# Patient Record
Sex: Female | Born: 1995 | Race: Black or African American | Hispanic: No | Marital: Single | Smoking: Never smoker
Health system: Southern US, Community
[De-identification: ages and names within clinical notes are randomized; demographics above are authoritative.]

## PROBLEM LIST (undated history)

## (undated) ENCOUNTER — Emergency Department: Admission: EM | Payer: BC Managed Care – PPO | Source: Home / Self Care

## (undated) DIAGNOSIS — F419 Anxiety disorder, unspecified: Secondary | ICD-10-CM

## (undated) DIAGNOSIS — I10 Essential (primary) hypertension: Secondary | ICD-10-CM

## (undated) DIAGNOSIS — F909 Attention-deficit hyperactivity disorder, unspecified type: Secondary | ICD-10-CM

## (undated) DIAGNOSIS — F319 Bipolar disorder, unspecified: Secondary | ICD-10-CM

## (undated) DIAGNOSIS — J45909 Unspecified asthma, uncomplicated: Secondary | ICD-10-CM

## (undated) DIAGNOSIS — F329 Major depressive disorder, single episode, unspecified: Secondary | ICD-10-CM

## (undated) HISTORY — DX: Bipolar disorder, unspecified: F31.9

## (undated) HISTORY — DX: Anxiety disorder, unspecified: F41.9

## (undated) HISTORY — PX: TONSILLECTOMY: SUR1361

## (undated) HISTORY — DX: Attention-deficit hyperactivity disorder, unspecified type: F90.9

## (undated) HISTORY — DX: Major depressive disorder, single episode, unspecified: F32.9

## (undated) HISTORY — PX: ADENOIDECTOMY: SUR15

---
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
## 2014-03-17 ENCOUNTER — Emergency Department: Payer: Self-pay | Admitting: Emergency Medicine

## 2014-07-18 ENCOUNTER — Emergency Department: Payer: Self-pay | Admitting: Emergency Medicine

## 2014-07-18 LAB — COMPREHENSIVE METABOLIC PANEL
ALBUMIN: 3.5 g/dL — AB (ref 3.8–5.6)
ANION GAP: 8 (ref 7–16)
AST: 16 U/L (ref 0–26)
Alkaline Phosphatase: 81 U/L
BUN: 9 mg/dL (ref 9–21)
Bilirubin,Total: 0.5 mg/dL (ref 0.2–1.0)
CHLORIDE: 104 mmol/L (ref 97–107)
Calcium, Total: 8.8 mg/dL — ABNORMAL LOW (ref 9.0–10.7)
Co2: 27 mmol/L — ABNORMAL HIGH (ref 16–25)
Creatinine: 0.9 mg/dL (ref 0.60–1.30)
EGFR (African American): >60
Glucose: 117 mg/dL — ABNORMAL HIGH (ref 65–99)
Osmolality: 277 (ref 275–301)
Potassium: 4.1 mmol/L (ref 3.3–4.7)
SGPT (ALT): 27 U/L
SODIUM: 139 mmol/L (ref 132–141)
Total Protein: 7.8 g/dL (ref 6.4–8.6)

## 2014-07-18 LAB — CBC WITH DIFFERENTIAL/PLATELET
BASOS ABS: 0.1 10*3/uL (ref 0.0–0.1)
Basophil %: 0.5 %
EOS PCT: 0.6 %
Eosinophil #: 0.1 10*3/uL (ref 0.0–0.7)
HCT: 33.7 % — ABNORMAL LOW (ref 35.0–47.0)
HGB: 10.3 g/dL — ABNORMAL LOW (ref 12.0–16.0)
Lymphocyte #: 1.9 10*3/uL (ref 1.0–3.6)
Lymphocyte %: 16.8 %
MCH: 25.2 pg — ABNORMAL LOW (ref 26.0–34.0)
MCHC: 30.6 g/dL — ABNORMAL LOW (ref 32.0–36.0)
MCV: 82 fL (ref 80–100)
MONOS PCT: 6.6 %
Monocyte #: 0.7 x10 3/mm (ref 0.2–0.9)
NEUTROS ABS: 8.3 10*3/uL — AB (ref 1.4–6.5)
NEUTROS PCT: 75.5 %
PLATELETS: 399 10*3/uL (ref 150–440)
RBC: 4.09 10*6/uL (ref 3.80–5.20)
RDW: 17.7 % — AB (ref 11.5–14.5)
WBC: 11 10*3/uL (ref 3.6–11.0)

## 2014-07-18 LAB — URINALYSIS, COMPLETE
BACTERIA: NONE SEEN
Bilirubin,UR: NEGATIVE
Blood: NEGATIVE
Glucose,UR: NEGATIVE mg/dL (ref 0–75)
KETONE: NEGATIVE
Leukocyte Esterase: NEGATIVE
Nitrite: NEGATIVE
Ph: 7 (ref 4.5–8.0)
Protein: NEGATIVE
RBC,UR: 1 /HPF (ref 0–5)
SPECIFIC GRAVITY: 1.014 (ref 1.003–1.030)
Squamous Epithelial: 6

## 2014-08-18 ENCOUNTER — Emergency Department: Payer: Self-pay | Admitting: Emergency Medicine

## 2015-02-09 ENCOUNTER — Other Ambulatory Visit: Payer: Self-pay

## 2015-02-09 ENCOUNTER — Encounter: Payer: Self-pay | Admitting: *Deleted

## 2015-02-09 ENCOUNTER — Emergency Department: Payer: Medicaid Other

## 2015-02-09 ENCOUNTER — Emergency Department
Admission: EM | Admit: 2015-02-09 | Discharge: 2015-02-09 | Disposition: A | Discharge status: Home / Self Care | Payer: Medicaid Other | Attending: Emergency Medicine | Admitting: Emergency Medicine

## 2015-02-09 DIAGNOSIS — R0789 Other chest pain: Secondary | ICD-10-CM

## 2015-02-09 DIAGNOSIS — Z79899 Other long term (current) drug therapy: Secondary | ICD-10-CM | POA: Diagnosis not present

## 2015-02-09 DIAGNOSIS — I10 Essential (primary) hypertension: Secondary | ICD-10-CM | POA: Insufficient documentation

## 2015-02-09 DIAGNOSIS — R079 Chest pain, unspecified: Secondary | ICD-10-CM | POA: Diagnosis present

## 2015-02-09 HISTORY — DX: Essential (primary) hypertension: I10

## 2015-02-09 HISTORY — DX: Unspecified asthma, uncomplicated: J45.909

## 2015-02-09 LAB — CBC
HCT: 33.5 % — ABNORMAL LOW (ref 35.0–47.0)
Hemoglobin: 10.5 g/dL — ABNORMAL LOW (ref 12.0–16.0)
MCH: 25.4 pg — ABNORMAL LOW (ref 26.0–34.0)
MCHC: 31.4 g/dL — ABNORMAL LOW (ref 32.0–36.0)
MCV: 80.8 fL (ref 80.0–100.0)
Platelets: 292 10*3/uL (ref 150–440)
RBC: 4.15 MIL/uL (ref 3.80–5.20)
RDW: 19 % — AB (ref 11.5–14.5)
WBC: 12.7 10*3/uL — AB (ref 3.6–11.0)

## 2015-02-09 LAB — BASIC METABOLIC PANEL
Anion gap: 9 (ref 5–15)
BUN: 9 mg/dL (ref 6–20)
CALCIUM: 9.1 mg/dL (ref 8.9–10.3)
CO2: 24 mmol/L (ref 22–32)
Chloride: 105 mmol/L (ref 101–111)
Creatinine, Ser: 0.8 mg/dL (ref 0.44–1.00)
GFR calc Af Amer: >60 mL/min (ref >60)
Glucose, Bld: 90 mg/dL (ref 65–99)
Potassium: 4.3 mmol/L (ref 3.5–5.1)
Sodium: 138 mmol/L (ref 135–145)

## 2015-02-09 LAB — TROPONIN I

## 2015-02-09 MED ORDER — RANITIDINE HCL 150 MG/10ML PO SYRP
150.0000 mg | ORAL_SOLUTION | Freq: Once | ORAL | Status: AC
  Administered 2015-02-09: 150 mg via ORAL
  Filled 2015-02-09: qty 10

## 2015-02-09 MED ORDER — GI COCKTAIL ~~LOC~~
ORAL | Status: AC
  Administered 2015-02-09: 30 mL via ORAL
  Filled 2015-02-09: qty 30

## 2015-02-09 MED ORDER — ALUM & MAG HYDROXIDE-SIMETH 200-200-20 MG/5ML PO SUSP: 30.0000 mL | Freq: Once | ORAL | Status: AC

## 2015-02-09 MED ORDER — KETOROLAC TROMETHAMINE 30 MG/ML IJ SOLN
INTRAMUSCULAR | Status: AC
  Filled 2015-02-09: qty 1

## 2015-02-09 MED ORDER — RANITIDINE HCL 150 MG PO TABS: 150.0000 mg | ORAL_TABLET | Freq: Two times a day (BID) | ORAL | Status: AC

## 2015-02-09 MED ORDER — KETOROLAC TROMETHAMINE 30 MG/ML IJ SOLN
30.0000 mg | Freq: Once | INTRAMUSCULAR | Status: AC
  Administered 2015-02-09: 30 mg via INTRAMUSCULAR

## 2015-02-09 NOTE — ED Provider Notes (Signed)
[facility] Emergency Department Provider Note  ____________________________________________  Time seen: 7:15 AM  I have reviewed the triage vital signs and the nursing notes.   HISTORY  Chief Complaint Chest Pain     HPI Kim Little is a [age] y.o. female who woke up approximately 2 in the morning with chest pain. She has a history of anemia and asthma. She has never had chest pain like this before. She was feeling well yesterday. The pain was fairly severe. It was in the middle of her chest. It did not radiate to other locations. It has improved at this point. The patient does not site any activities or circumstances that make the pain better or worse.     Past Medical History  Diagnosis Date  . Asthma   . Hypertension     There are no active problems to display for this patient.   Past Surgical History  Procedure Laterality Date  . Tonsillectomy    . Adenoidectomy      Current Outpatient Rx  Name  Route  Sig  Dispense  Refill  . albuterol (PROVENTIL HFA;VENTOLIN HFA) 108 (90 BASE) MCG/ACT inhaler   Inhalation   Inhale 2 puffs into the lungs every 6 (six) hours as needed for wheezing or shortness of breath.         . IRON PO   Oral   Take 1 tablet by mouth daily.         Marland Kitchen. lisdexamfetamine (VYVANSE) 40 MG capsule   Oral   Take 40 mg by mouth every morning.         Marland Kitchen. lisinopril (PRINIVIL,ZESTRIL) 10 MG tablet   Oral   Take 10 mg by mouth daily.         . ranitidine (ZANTAC) 150 MG tablet   Oral   Take 1 tablet (150 mg total) by mouth 2 (two) times daily.   28 tablet   1     Allergies Review of patient's allergies indicates no known allergies.  No family history on file.  Social History History  Substance Use Topics  . Smoking status: Never Smoker   . Smokeless tobacco: Not on file  . Alcohol Use: No    Review of Systems  Constitutional: Negative for fever. ENT: Negative for sore  throat. Cardiovascular: Positive for chest pain. Respiratory: Negative for shortness of breath. Gastrointestinal: Negative for abdominal pain, vomiting and diarrhea, though the patient does report a history of irritable bowel syndrome. Genitourinary: Negative for dysuria. Musculoskeletal: Negative for back pain. Skin: Negative for rash. Neurological: Negative for headaches   10-point ROS otherwise negative.  ____________________________________________   PHYSICAL EXAM:  VITAL SIGNS: ED Triage Vitals  Enc Vitals Group     BP 02/09/15 0250 126/63 mmHg     Pulse Rate 02/09/15 0250 84     Resp 02/09/15 0250 18     Temp 02/09/15 0250 98.3 F (36.8 C)     Temp src --      SpO2 02/09/15 0250 99 %     Weight 02/09/15 0250 355 lb (161.027 kg)     Height 02/09/15 0250 5\' 9"  (1.753 m)     Head Cir --      Peak Flow --      Pain Score 02/09/15 0251 10     Pain Loc --      Pain Edu? --      Excl. in GC? --     Constitutional: Alert and oriented. Patient is  interactive and communicative. Notable obesity. ENT   Head: Normocephalic and atraumatic.   Nose: No congestion/rhinnorhea.   Mouth/Throat: Mucous membranes are moist. Cardiovascular: Normal rate, regular rhythm. There is a mild 1 to 2/6 ejection murmur. The chest wall is notably tender on palpation, especially moving into the left pectoralis. Respiratory: Normal respiratory effort without tachypnea. Breath sounds are clear and equal bilaterally. No wheezes/rales/rhonchi. Gastrointestinal: Soft and nontender. No distention.  Back: There is no CVA tenderness. Musculoskeletal: Nontender with normal range of motion in all extremities.  No noted edema of either extremity. Neurologic:  Normal speech and language. No gross focal neurologic deficits are appreciated.  Skin:  Skin is warm, dry. No rash noted. Psychiatric: Mood and affect are normal. Speech and behavior are normal.  ____________________________________________     LABS (pertinent positives/negatives)  Notable for anemia with hemoglobin of 10.5. White blood cell count of 12.7. Troponin that was ordered by the nurses is negative.  ____________________________________________   EKG  At 2:56 AM normal sinus rhythm at 78 with a slightly [place] axis at 96. Intervals are within normal limits. No ST elevation or depression.  ____________________________________________    RADIOLOGY  Normal chest x-ray  ____________________________________________   PROCEDURES  Procedure(s) performed: None  Critical Care performed: No  ____________________________________________   INITIAL IMPRESSION / ASSESSMENT AND PLAN / ED COURSE  I directly reviewed my standard differential diagnosis with the patient and her mother. At 18 think the chance of coronary disease is extremely low. EKG and troponin are normal. There is no sign that this is a pulmonary emboli. She has a normal heart rate, oxygenation of 100% on room air, no known risk factors, no asymmetrical swelling. The patient is notably tender on palpation of the chest wall. It is unclear if this is the same as the pain she was having this past evening. Given the time factors, that the patient woke up with this pain, I believe it's more likely that this could be esophageal. We will treat the patient with a GI cocktail and Zantac in case this is esophageal and, after discussion with the patient and her mother, a shot of Toradol for possible chest wall pain.  I've asked the patient to follow up with D primary care admit been is been her primary provider in the past. I advised him to return to the emergency Department if things are worse or they have any other concerns.  ____________________________________________   FINAL CLINICAL IMPRESSION(S) / ED DIAGNOSES  Final diagnoses:  Chest pain of unknown etiology      Darien Ramusavid W Elyanna Wallick, MD 02/09/15 1515

## 2015-02-09 NOTE — ED Notes (Signed)
Pt reports she woke up tonight with a sharp pain in the center of her chest.

## 2015-02-09 NOTE — Discharge Instructions (Signed)
It is not clear what is causing her chest pain specifically today. We discussed a list of possibilities. Your blood tests and EKG and chest x-ray looked good. There is a strong possibility this is from your esophagus. Take Zantac as prescribed. Follow-up with your regular doctor this coming week for further assistance. Return to the emergency department if you feel worse, if he have shortness of breath, if you have any fever, or you have other urgent concerns.  Chest Pain (Nonspecific) It is often hard to give a specific diagnosis for the cause of chest pain. There is always a chance that your pain could be related to something serious, such as a heart attack or a blood clot in the lungs. You need to follow up with your health care provider for further evaluation. CAUSES   Heartburn.  Pneumonia or bronchitis.  Anxiety or stress.  Inflammation around your heart (pericarditis) or lung (pleuritis or pleurisy).  A blood clot in the lung.  A collapsed lung (pneumothorax). It can develop suddenly on its own (spontaneous pneumothorax) or from trauma to the chest.  Shingles infection (herpes zoster virus). The chest wall is composed of bones, muscles, and cartilage. Any of these can be the source of the pain.  The bones can be bruised by injury.  The muscles or cartilage can be strained by coughing or overwork.  The cartilage can be affected by inflammation and become sore (costochondritis). DIAGNOSIS  Lab tests or other studies may be needed to find the cause of your pain. Your health care provider may have you take a test called an ambulatory electrocardiogram (ECG). An ECG records your heartbeat patterns over a 24-hour period. You may also have other tests, such as:  Transthoracic echocardiogram (TTE). During echocardiography, sound waves are used to evaluate how blood flows through your heart.  Transesophageal echocardiogram (TEE).  Cardiac monitoring. This allows your health care  provider to monitor your heart rate and rhythm in real time.  Holter monitor. This is a portable device that records your heartbeat and can help diagnose heart arrhythmias. It allows your health care provider to track your heart activity for several days, if needed.  Stress tests by exercise or by giving medicine that makes the heart beat faster. TREATMENT   Treatment depends on what may be causing your chest pain. Treatment may include:  Acid blockers for heartburn.  Anti-inflammatory medicine.  Pain medicine for inflammatory conditions.  Antibiotics if an infection is present.  You may be advised to change lifestyle habits. This includes stopping smoking and avoiding alcohol, caffeine, and chocolate.  You may be advised to keep your head raised (elevated) when sleeping. This reduces the chance of acid going backward from your stomach into your esophagus. Most of the time, nonspecific chest pain will improve within 2-3 days with rest and mild pain medicine.  HOME CARE INSTRUCTIONS   If antibiotics were prescribed, take them as directed. Finish them even if you start to feel better.  For the next few days, avoid physical activities that bring on chest pain. Continue physical activities as directed.  Do not use any tobacco products, including cigarettes, chewing tobacco, or electronic cigarettes.  Avoid drinking alcohol.  Only take medicine as directed by your health care provider.  Follow your health care provider's suggestions for further testing if your chest pain does not go away.  Keep any follow-up appointments you made. If you do not go to an appointment, you could develop lasting (chronic) problems with pain. If  there is any problem keeping an appointment, call to reschedule. SEEK MEDICAL CARE IF:   Your chest pain does not go away, even after treatment.  You have a rash with blisters on your chest.  You have a fever. SEEK IMMEDIATE MEDICAL CARE IF:   You have  increased chest pain or pain that spreads to your arm, neck, jaw, back, or abdomen.  You have shortness of breath.  You have an increasing cough, or you cough up blood.  You have severe back or abdominal pain.  You feel nauseous or vomit.  You have severe weakness.  You faint.  You have chills. This is an emergency. Do not wait to see if the pain will go away. Get medical help at once. Call your local emergency services (911 in [place] Do not drive yourself to the hospital. MAKE SURE YOU:   Understand these instructions.  Will watch your condition.  Will get help right away if you are not doing well or get worse. Document Released: 06/26/2005 Document Revised: 09/21/2013 Document Reviewed: 04/21/2008 [facility] Patient Information 2015 [place] [place]. This information is not intended to replace advice given to you by your health care provider. Make sure you discuss any questions you have with your health care provider.

## 2015-04-02 ENCOUNTER — Encounter: Payer: Self-pay | Admitting: Emergency Medicine

## 2015-04-02 DIAGNOSIS — G43909 Migraine, unspecified, not intractable, without status migrainosus: Secondary | ICD-10-CM | POA: Diagnosis present

## 2015-04-02 DIAGNOSIS — Z87891 Personal history of nicotine dependence: Secondary | ICD-10-CM | POA: Diagnosis not present

## 2015-04-02 DIAGNOSIS — Z79899 Other long term (current) drug therapy: Secondary | ICD-10-CM | POA: Diagnosis not present

## 2015-04-02 DIAGNOSIS — I10 Essential (primary) hypertension: Secondary | ICD-10-CM | POA: Insufficient documentation

## 2015-04-02 DIAGNOSIS — G43819 Other migraine, intractable, without status migrainosus: Secondary | ICD-10-CM | POA: Insufficient documentation

## 2015-04-02 NOTE — ED Notes (Signed)
Pt states daily headache for "two weeks" pt states had nausea earlier today, but none now. Pt states has been sensitive to light, but not currently. Pt denies known fever.

## 2015-04-03 ENCOUNTER — Emergency Department: Payer: Medicaid Other

## 2015-04-03 ENCOUNTER — Emergency Department
Admission: EM | Admit: 2015-04-03 | Discharge: 2015-04-03 | Disposition: A | Discharge status: Home / Self Care | Payer: Medicaid Other | Attending: Emergency Medicine | Admitting: Emergency Medicine

## 2015-04-03 DIAGNOSIS — G43819 Other migraine, intractable, without status migrainosus: Secondary | ICD-10-CM

## 2015-04-03 MED ORDER — METOCLOPRAMIDE HCL 5 MG/ML IJ SOLN
INTRAMUSCULAR | Status: AC
  Filled 2015-04-03: qty 2

## 2015-04-03 MED ORDER — KETOROLAC TROMETHAMINE 30 MG/ML IJ SOLN
INTRAMUSCULAR | Status: AC
  Filled 2015-04-03: qty 1

## 2015-04-03 MED ORDER — BUTALBITAL-APAP-CAFFEINE 50-325-40 MG PO TABS
ORAL_TABLET | ORAL | Status: AC
  Filled 2015-04-03: qty 2

## 2015-04-03 MED ORDER — METHYLPREDNISOLONE SODIUM SUCC 125 MG IJ SOLR
125.0000 mg | Freq: Once | INTRAMUSCULAR | Status: AC
  Administered 2015-04-03: 125 mg via INTRAVENOUS

## 2015-04-03 MED ORDER — BUTALBITAL-APAP-CAFFEINE 50-325-40 MG PO TABS
2.0000 | ORAL_TABLET | Freq: Once | ORAL | Status: AC
  Administered 2015-04-03: 2 via ORAL

## 2015-04-03 MED ORDER — DIPHENHYDRAMINE HCL 50 MG/ML IJ SOLN
INTRAMUSCULAR | Status: AC
  Filled 2015-04-03: qty 1

## 2015-04-03 MED ORDER — BUTALBITAL-APAP-CAFFEINE 50-325-40 MG PO TABS: 1.0000 | ORAL_TABLET | Freq: Four times a day (QID) | ORAL | Status: AC | PRN

## 2015-04-03 MED ORDER — KETOROLAC TROMETHAMINE 30 MG/ML IJ SOLN
30.0000 mg | Freq: Once | INTRAMUSCULAR | Status: AC
  Administered 2015-04-03: 30 mg via INTRAVENOUS

## 2015-04-03 MED ORDER — METHYLPREDNISOLONE SODIUM SUCC 125 MG IJ SOLR
INTRAMUSCULAR | Status: AC
  Filled 2015-04-03: qty 2

## 2015-04-03 MED ORDER — DIPHENHYDRAMINE HCL 50 MG/ML IJ SOLN
25.0000 mg | Freq: Once | INTRAMUSCULAR | Status: AC
  Administered 2015-04-03: 25 mg via INTRAVENOUS

## 2015-04-03 MED ORDER — METOCLOPRAMIDE HCL 5 MG/ML IJ SOLN
10.0000 mg | Freq: Once | INTRAMUSCULAR | Status: AC
  Administered 2015-04-03: 10 mg via INTRAVENOUS

## 2015-04-03 MED ORDER — SODIUM CHLORIDE 0.9 % IV BOLUS (SEPSIS)
1000.0000 mL | Freq: Once | INTRAVENOUS | Status: AC
  Administered 2015-04-03: 1000 mL via INTRAVENOUS

## 2015-04-03 NOTE — Discharge Instructions (Signed)

## 2015-04-03 NOTE — ED Notes (Signed)
Report to anna, rn

## 2015-04-03 NOTE — ED Provider Notes (Signed)
[facility] Emergency Department Provider Note  ____________________________________________  Time seen: Approximately 310 AM  I have reviewed the triage vital signs and the nursing notes.   HISTORY  Chief Complaint Headache    HPI Kim Little is a 19 y.o. female with a history of migraines who comes in with a headache 2 weeks. The patient reports that the headache seems to be worse today. She has tried Tylenol, Aleve, Excedrin. The patient reports that her headaches have been worse than this in the past but has never lasted this long. She reports her pain as a 9 out of 10 in intensity. She's had some nausea with no vomiting and some light and sound sensitivity. The patient reports that she has not had any change in caffeine intake nor has she had any decrease in sleep. The patient reports that the symptoms are typical of her migraines. The patient also reports that she has not taken her blood pressure medicine over a month. She reports that she has not been able to follow-up with her doctor. The patient is not able to tolerate the symptoms and the pain so she decided to come in for further evaluation and treatment.   Past Medical History  Diagnosis Date  . Asthma   . Hypertension   migraines Anemia  There are no active problems to display for this patient.   Past Surgical History  Procedure Laterality Date  . Tonsillectomy    . Adenoidectomy      Current Outpatient Rx  Name  Route  Sig  Dispense  Refill  . albuterol (PROVENTIL HFA;VENTOLIN HFA) 108 (90 BASE) MCG/ACT inhaler   Inhalation   Inhale 2 puffs into the lungs every 6 (six) hours as needed for wheezing or shortness of breath.         . lisdexamfetamine (VYVANSE) 40 MG capsule   Oral   Take 40 mg by mouth every morning.         Marland Kitchen lisinopril (PRINIVIL,ZESTRIL) 10 MG tablet   Oral   Take 10 mg by mouth daily.         . butalbital-acetaminophen-caffeine (FIORICET) 50-325-40  MG per tablet   Oral   Take 1-2 tablets by mouth every 6 (six) hours as needed for headache.   16 tablet   0   . IRON PO   Oral   Take 1 tablet by mouth daily.         . ranitidine (ZANTAC) 150 MG tablet   Oral   Take 1 tablet (150 mg total) by mouth 2 (two) times daily.   28 tablet   1     Allergies Review of patient's allergies indicates no known allergies.  History reviewed. No pertinent family history.  Social History History  Substance Use Topics  . Smoking status: Former Games developer  . Smokeless tobacco: Never Used  . Alcohol Use: No    Review of Systems Constitutional: No fever/chills Eyes: No visual changes. ENT: No sore throat. Cardiovascular: Denies chest pain. Respiratory: Denies shortness of breath. Gastrointestinal: No abdominal pain.  No nausea, no vomiting.  Genitourinary: Negative for dysuria. Musculoskeletal: Negative for back pain. Skin: Negative for rash. Neurological: Migraine headache  10-point ROS otherwise negative.  ____________________________________________   PHYSICAL EXAM:  VITAL SIGNS: ED Triage Vitals  Enc Vitals Group     BP 04/02/15 2227 140/75 mmHg     Pulse Rate 04/02/15 2227 80     Resp 04/02/15 2227 16     Temp 04/02/15  2227 98.9 F (37.2 C)     Temp Source 04/02/15 2227 Oral     SpO2 04/02/15 2227 100 %     Weight 04/02/15 2227 355 lb (161.027 kg)     Height 04/02/15 2227 5\' 9"  (1.753 m)     Head Cir --      Peak Flow --      Pain Score 04/02/15 2228 8     Pain Loc --      Pain Edu? --      Excl. in GC? --     Constitutional: Alert and oriented. Well appearing and in moderate distress. Eyes: Conjunctivae are normal. PERRL. EOMI. Head: Atraumatic. Nose: No congestion/rhinnorhea. Mouth/Throat: Mucous membranes are moist.  Oropharynx non-erythematous. Cardiovascular: Normal rate, regular rhythm. Grossly normal heart sounds.  Good peripheral circulation. Respiratory: Normal respiratory effort.  No retractions.  Lungs CTAB. Gastrointestinal: Soft and nontender. No distention. Positive bowel sounds Genitourinary: Deferred Musculoskeletal: No lower extremity tenderness nor edema.  Neurologic:  Normal speech and language. No gross focal neurologic deficits are appreciated. Cranial nerves II through XII are grossly intact Skin:  Skin is warm, dry and intact. No rash noted. Psychiatric: Mood and affect are normal.   ____________________________________________   LABS (all labs ordered are listed, but only abnormal results are displayed)  Labs Reviewed - No data to display ____________________________________________  EKG  None ____________________________________________  RADIOLOGY  CT head: Normal head CT ____________________________________________   PROCEDURES  Procedure(s) performed: None  Critical Care performed: No  ____________________________________________   INITIAL IMPRESSION / ASSESSMENT AND PLAN / ED COURSE  Pertinent labs & imaging results that were available during my care of the patient were reviewed by me and considered in my medical decision making (see chart for details).  This is a 19 year old female who has a history of migraines who comes in tonight with a migraine headache for 2 weeks. I will give the patient a dose of Reglan, Benadryl and Toradol as well as a liter of normal saline and I will reassess the patient when she's had these medications.  ----------------------------------------- 6:40 AM on 04/03/2015 -----------------------------------------  The patient reports that she still has a headache that the 7 out of 10 after the medication. I will give her some Solu-Medrol as well as some Fioricet and do a CT scan of the patient's head to determine if there is another cause of her headache. She will be reassessed after she receives these medications.  ----------------------------------------- 7:52 AM on  04/03/2015 -----------------------------------------  The patient reports her headache is improved although not completely gone. She reports though that she does not want any further workup and will feel comfortable being discharged home. I did inform the patient that she can return if any symptoms worsen and I will give her some Fioricet for home. ____________________________________________   FINAL CLINICAL IMPRESSION(S) / ED DIAGNOSES  Final diagnoses:  Other migraine without status migrainosus, intractable      Rebecka ApleyAllison P Webster, MD 04/03/15 [phone]

## 2015-04-03 NOTE — ED Notes (Signed)
Patient transported to CT 

## 2015-06-06 DIAGNOSIS — I10 Essential (primary) hypertension: Secondary | ICD-10-CM | POA: Insufficient documentation

## 2015-06-06 DIAGNOSIS — Z79899 Other long term (current) drug therapy: Secondary | ICD-10-CM | POA: Insufficient documentation

## 2015-06-06 DIAGNOSIS — J029 Acute pharyngitis, unspecified: Secondary | ICD-10-CM | POA: Insufficient documentation

## 2015-06-06 NOTE — ED Notes (Addendum)
Pt states she woke up this morning with diff swallowing.  States worse tonight.  Pt states my throat feels tight.   No sore throat.  No resp distress.

## 2015-06-07 ENCOUNTER — Emergency Department
Admission: EM | Admit: 2015-06-07 | Discharge: 2015-06-07 | Disposition: A | Discharge status: Home / Self Care | Payer: Medicaid Other | Attending: Emergency Medicine | Admitting: Emergency Medicine

## 2015-06-07 ENCOUNTER — Encounter: Payer: Self-pay | Admitting: *Deleted

## 2015-06-07 ENCOUNTER — Emergency Department: Payer: Medicaid Other

## 2015-06-07 DIAGNOSIS — J029 Acute pharyngitis, unspecified: Secondary | ICD-10-CM

## 2015-06-07 LAB — POCT RAPID STREP A: Streptococcus, Group A Screen (Direct): NEGATIVE

## 2015-06-07 MED ORDER — AMOXICILLIN-POT CLAVULANATE 875-125 MG PO TABS
1.0000 | ORAL_TABLET | Freq: Once | ORAL | Status: AC
Start: 2015-06-07 — End: 2015-06-07
  Administered 2015-06-07: 1 via ORAL
  Filled 2015-06-07: qty 1

## 2015-06-07 MED ORDER — IBUPROFEN 400 MG PO TABS
400.0000 mg | ORAL_TABLET | Freq: Once | ORAL | Status: AC
  Administered 2015-06-07: 400 mg via ORAL
  Filled 2015-06-07: qty 1

## 2015-06-07 MED ORDER — AMOXICILLIN-POT CLAVULANATE 875-125 MG PO TABS: 1.0000 | ORAL_TABLET | Freq: Two times a day (BID) | ORAL | Status: AC

## 2015-06-07 NOTE — ED Notes (Signed)
Patient transported to X-ray 

## 2015-06-07 NOTE — ED Notes (Signed)
Lab notified of add on

## 2015-06-07 NOTE — Discharge Instructions (Signed)

## 2015-06-07 NOTE — ED Provider Notes (Signed)
[facility] Emergency Department Provider Note  ____________________________________________  Time seen: 3:00 AM  I have reviewed the triage vital signs and the nursing notes.   HISTORY  Chief Complaint Oral Swelling      HPI Kim Little is a 19 y.o. female presents with sore throat on awakening tonight patient states my throat feels tight and scratchy.     Past Medical History  Diagnosis Date  . Asthma   . Hypertension     There are no active problems to display for this patient.   Past Surgical History  Procedure Laterality Date  . Tonsillectomy    . Adenoidectomy      Current Outpatient Rx  Name  Route  Sig  Dispense  Refill  . albuterol (PROVENTIL HFA;VENTOLIN HFA) 108 (90 BASE) MCG/ACT inhaler   Inhalation   Inhale 2 puffs into the lungs every 6 (six) hours as needed for wheezing or shortness of breath.         . butalbital-acetaminophen-caffeine (FIORICET) 50-325-40 MG per tablet   Oral   Take 1-2 tablets by mouth every 6 (six) hours as needed for headache.   16 tablet   0   . IRON PO   Oral   Take 1 tablet by mouth daily.         Marland Kitchen lisdexamfetamine (VYVANSE) 40 MG capsule   Oral   Take 40 mg by mouth every morning.         Marland Kitchen lisinopril (PRINIVIL,ZESTRIL) 10 MG tablet   Oral   Take 10 mg by mouth daily.         . ranitidine (ZANTAC) 150 MG tablet   Oral   Take 1 tablet (150 mg total) by mouth 2 (two) times daily.   28 tablet   1     Allergies Review of patient's allergies indicates no known allergies.  No family history on file.  Social History Social History  Substance Use Topics  . Smoking status: Never Smoker   . Smokeless tobacco: Never Used  . Alcohol Use: No    Review of Systems  Constitutional: Negative for fever. Eyes: Negative for visual changes. ENT: Positive for sore throat. Cardiovascular: Negative for chest pain. Respiratory: Negative for shortness of  breath. Gastrointestinal: Negative for abdominal pain, vomiting and diarrhea. Genitourinary: Negative for dysuria. Musculoskeletal: Negative for back pain. Skin: Negative for rash. Neurological: Negative for headaches, focal weakness or numbness.   10-point ROS otherwise negative.  ____________________________________________   PHYSICAL EXAM:  VITAL SIGNS: ED Triage Vitals  Enc Vitals Group     BP 06/07/15 0000 133/67 mmHg     Pulse Rate 06/07/15 0000 79     Resp 06/07/15 0000 20     Temp 06/07/15 0000 98 F (36.7 C)     Temp Source 06/07/15 0000 Oral     SpO2 06/07/15 0000 100 %     Weight 06/07/15 0000 365 lb (165.563 kg)     Height 06/07/15 0000  (1.753 m)     Head Cir --      Peak Flow --      Pain Score --      Pain Loc --      Pain Edu? --      Excl. in GC? --     Constitutional: Alert and oriented. Well appearing and in no distress. Eyes: Conjunctivae are normal. PERRL. Normal extraocular movements. ENT   Head: Normocephalic and atraumatic.   Nose: No congestion/rhinnorhea.   Mouth/Throat:  Mucous membranes are moist.   Neck: No stridor. No erythema or exudate noted. Hematological/Lymphatic/Immunilogical: No cervical lymphadenopathy. Cardiovascular: Normal rate, regular rhythm. Normal and symmetric distal pulses are present in all extremities. No murmurs, rubs, or gallops. Respiratory: Normal respiratory effort without tachypnea nor retractions. Breath sounds are clear and equal bilaterally. No wheezes/rales/rhonchi. Gastrointestinal: Soft and nontender. No distention. There is no CVA tenderness. Genitourinary: deferred Musculoskeletal: Nontender with normal range of motion in all extremities. No joint effusions.  No lower extremity tenderness nor edema. Neurologic:  Normal speech and language. No gross focal neurologic deficits are appreciated. Speech is normal.  Skin:  Skin is warm, dry and intact. No rash noted. Psychiatric: Mood and affect  are normal. Speech and behavior are normal. Patient exhibits appropriate insight and judgment.  ____________________________________________    LABS (pertinent positives/negatives)  Rapid strep negative  RADIOLOGY  DG Neck Soft Tissue (Final result) Result time: 06/07/15 03:20:38   Final result by Rad Results In Interface (06/07/15 03:20:38)   Narrative:   CLINICAL DATA: Patient woke up this morning with difficulty swallowing. Throat feels tight.  EXAM: NECK SOFT TISSUES - 1+ VIEW  COMPARISON: None.  FINDINGS: There is no evidence of retropharyngeal soft tissue swelling or epiglottic enlargement. The cervical airway is unremarkable and no radio-opaque foreign body identified.  IMPRESSION: Negative.   Electronically Signed By: Burman Nieves M.D. On: 06/07/2015 03:20        INITIAL IMPRESSION / ASSESSMENT AND PLAN / ED COURSE  Pertinent labs & imaging results that were available during my care of the patient were reviewed by me and considered in my medical decision making (see chart for details).    ____________________________________________   FINAL CLINICAL IMPRESSION(S) / ED DIAGNOSES  Final diagnoses:  None      Darci Current, MD 06/07/15 [phone]

## 2015-06-09 LAB — CULTURE, GROUP A STREP (THRC)

## 2015-06-21 ENCOUNTER — Emergency Department: Payer: Medicaid Other

## 2015-06-21 ENCOUNTER — Emergency Department
Admission: EM | Admit: 2015-06-21 | Discharge: 2015-06-21 | Disposition: A | Discharge status: Home / Self Care | Payer: Self-pay | Attending: Emergency Medicine | Admitting: Emergency Medicine

## 2015-06-21 ENCOUNTER — Encounter: Payer: Self-pay | Admitting: Emergency Medicine

## 2015-06-21 DIAGNOSIS — J45901 Unspecified asthma with (acute) exacerbation: Secondary | ICD-10-CM | POA: Insufficient documentation

## 2015-06-21 DIAGNOSIS — I10 Essential (primary) hypertension: Secondary | ICD-10-CM | POA: Insufficient documentation

## 2015-06-21 DIAGNOSIS — R079 Chest pain, unspecified: Secondary | ICD-10-CM

## 2015-06-21 LAB — BASIC METABOLIC PANEL
ANION GAP: 6 (ref 5–15)
BUN: 9 mg/dL (ref 6–20)
CALCIUM: 8.9 mg/dL (ref 8.9–10.3)
CO2: 27 mmol/L (ref 22–32)
Chloride: 107 mmol/L (ref 101–111)
Creatinine, Ser: 0.72 mg/dL (ref 0.44–1.00)
Glucose, Bld: 100 mg/dL — ABNORMAL HIGH (ref 65–99)
Potassium: 4.1 mmol/L (ref 3.5–5.1)
SODIUM: 140 mmol/L (ref 135–145)

## 2015-06-21 LAB — CBC
HEMATOCRIT: 31.8 % — AB (ref 35.0–47.0)
Hemoglobin: 10.4 g/dL — ABNORMAL LOW (ref 12.0–16.0)
MCH: 25.8 pg — ABNORMAL LOW (ref 26.0–34.0)
MCHC: 32.6 g/dL (ref 32.0–36.0)
MCV: 79.2 fL — ABNORMAL LOW (ref 80.0–100.0)
PLATELETS: 426 10*3/uL (ref 150–440)
RBC: 4.02 MIL/uL (ref 3.80–5.20)
RDW: 21 % — AB (ref 11.5–14.5)
WBC: 10.9 10*3/uL (ref 3.6–11.0)

## 2015-06-21 MED ORDER — IBUPROFEN 800 MG PO TABS: 800.0000 mg | ORAL_TABLET | Freq: Three times a day (TID) | ORAL | Status: DC | PRN

## 2015-06-21 NOTE — ED Provider Notes (Signed)
[facility] Emergency Department Geral Coker Note     Time seen: ----------------------------------------- 9:58 AM on 06/21/2015 -----------------------------------------    I have reviewed the triage vital signs and the nursing notes.   HISTORY  Chief Complaint Chest Pain    HPI Kim Little is a 19 y.o. female who presents ER for chest tightness this morning. Patient also describes pain around her left scapula, denies fevers chills other complaints. Patient denies anxiety, has not had this happen to her before. Patient recently seen in the ER for sore throat.   Past Medical History  Diagnosis Date  . Asthma   . Hypertension     There are no active problems to display for this patient.   Past Surgical History  Procedure Laterality Date  . Tonsillectomy    . Adenoidectomy      Allergies Review of patient's allergies indicates no known allergies.  Social History Social History  Substance Use Topics  . Smoking status: Never Smoker   . Smokeless tobacco: Never Used  . Alcohol Use: No    Review of Systems Constitutional: Negative for fever. Eyes: Negative for visual changes. ENT: Negative for sore throat. Cardiovascular: Positive for chest pain Respiratory: Positive for shortness of breath Gastrointestinal: Negative for abdominal pain, vomiting and diarrhea. Genitourinary: Negative for dysuria. Musculoskeletal: Negative for back pain. Skin: Negative for rash. Neurological: Negative for headaches, focal weakness or numbness.  10-point ROS otherwise negative.  ____________________________________________   PHYSICAL EXAM:  VITAL SIGNS: ED Triage Vitals  Enc Vitals Group     BP 06/21/15 0710 151/71 mmHg     Pulse Rate 06/21/15 0710 71     Resp 06/21/15 0710 20     Temp 06/21/15 0710 97.8 F (36.6 C)     Temp Source 06/21/15 0710 Oral     SpO2 06/21/15 0710 97 %     Weight 06/21/15 0710 365 lb (165.563 kg)     Height  06/21/15 0710  (1.753 m)     Head Cir --      Peak Flow --      Pain Score 06/21/15 0711 8     Pain Loc --      Pain Edu? --      Excl. in GC? --     Constitutional: Alert and oriented. Well appearing and in no distress. Eyes: Conjunctivae are normal. PERRL. Normal extraocular movements. ENT   Head: Normocephalic and atraumatic.   Nose: No congestion/rhinnorhea.   Mouth/Throat: Mucous membranes are moist.   Neck: No stridor. Cardiovascular: Normal rate, regular rhythm. Normal and symmetric distal pulses are present in all extremities. No murmurs, rubs, or gallops. Respiratory: Normal respiratory effort without tachypnea nor retractions. Breath sounds are clear and equal bilaterally. No wheezes/rales/rhonchi. Gastrointestinal: Soft and nontender. No distention. No abdominal bruits.  Musculoskeletal: Nontender with normal range of motion in all extremities. No joint effusions.  No lower extremity tenderness nor edema. Neurologic:  Normal speech and language. No gross focal neurologic deficits are appreciated. Speech is normal. No gait instability. Skin:  Skin is warm, dry and intact. No rash noted. Psychiatric: Mood and affect are normal. Speech and behavior are normal. Patient exhibits appropriate insight and judgment. ____________________________________________  EKG: Interpreted by me. Normal sinus rhythm with normal axis normal intervals. No evidence of hypertrophy or acute infarction. Rate is 73 bpm  ____________________________________________  ED COURSE:  Pertinent labs & imaging results that were available during my care of the patient were reviewed by me and considered  in my medical decision making (see chart for details). Patient's no acute distress, doubt acute serious etiology. ____________________________________________    LABS (pertinent positives/negatives)  Labs Reviewed  BASIC METABOLIC PANEL - Abnormal; Notable for the following:    Glucose,  Bld 100 (*)    All other components within normal limits  CBC - Abnormal; Notable for the following:    Hemoglobin 10.4 (*)    HCT 31.8 (*)    MCV 79.2 (*)    MCH 25.8 (*)    RDW 21.0 (*)    All other components within normal limits    RADIOLOGY Images were viewed by me  Chest x-ray is unremarkable  ____________________________________________  FINAL ASSESSMENT AND PLAN  Chest pain  Plan: Patient with labs and imaging as dictated above. No clear cause for her symptoms are found. She discharged Motrin and advised follow-up with her doctor in 2 days for recheck.   Emily Filbert, MD   Emily Filbert, MD 06/21/15 [phone]

## 2015-06-21 NOTE — Discharge Instructions (Signed)

## 2015-06-21 NOTE — ED Notes (Signed)
Pt presents with chest tightness this am. No acute distress noted in triage.

## 2016-05-17 ENCOUNTER — Emergency Department: Payer: Medicaid Other

## 2016-05-17 ENCOUNTER — Emergency Department
Admission: EM | Admit: 2016-05-17 | Discharge: 2016-05-17 | Disposition: A | Discharge status: Home / Self Care | Payer: Medicaid Other | Attending: Emergency Medicine | Admitting: Emergency Medicine

## 2016-05-17 DIAGNOSIS — S40011A Contusion of right shoulder, initial encounter: Secondary | ICD-10-CM | POA: Diagnosis not present

## 2016-05-17 DIAGNOSIS — I10 Essential (primary) hypertension: Secondary | ICD-10-CM | POA: Diagnosis not present

## 2016-05-17 DIAGNOSIS — Y9301 Activity, walking, marching and hiking: Secondary | ICD-10-CM | POA: Diagnosis not present

## 2016-05-17 DIAGNOSIS — T148XXA Other injury of unspecified body region, initial encounter: Secondary | ICD-10-CM

## 2016-05-17 DIAGNOSIS — W2209XA Striking against other stationary object, initial encounter: Secondary | ICD-10-CM | POA: Diagnosis not present

## 2016-05-17 DIAGNOSIS — Y929 Unspecified place or not applicable: Secondary | ICD-10-CM | POA: Diagnosis not present

## 2016-05-17 DIAGNOSIS — M25511 Pain in right shoulder: Secondary | ICD-10-CM | POA: Diagnosis present

## 2016-05-17 DIAGNOSIS — J45909 Unspecified asthma, uncomplicated: Secondary | ICD-10-CM | POA: Insufficient documentation

## 2016-05-17 DIAGNOSIS — Y999 Unspecified external cause status: Secondary | ICD-10-CM | POA: Diagnosis not present

## 2016-05-17 MED ORDER — IBUPROFEN 600 MG PO TABS: 600.0000 mg | ORAL_TABLET | Freq: Four times a day (QID) | ORAL | 0 refills | Status: DC | PRN

## 2016-05-17 NOTE — ED Notes (Signed)
Reviewed pt's prescription. Pt verbalized understanding

## 2016-05-17 NOTE — ED Provider Notes (Signed)
[facility]-EMERGENCY DEPARTMENT Provider Note   CSN: [id number] Arrival date & time: 05/17/16  2027     History   Chief Complaint Chief Complaint  Patient presents with  . Shoulder Pain    HPI Kim Little is a 20 y.o. female who presents today for his right shoulder pain. Patient states just prior to arrival, she was walking through a door and hit her right shoulder against the door frame. She complains of right anterior shoulder pain. Pain is moderate, she has not had a medications for pain. She denies any neck pain numbness tingling or radicular symptoms. She describes the pain as sharp, increased with touch and movement of the right shoulder. She has painful abduction and flexion of the right arm at the shoulder joint.  HPI  Past Medical History:  Diagnosis Date  . Asthma   . Hypertension     There are no active problems to display for this patient.   Past Surgical History:  Procedure Laterality Date  . ADENOIDECTOMY    . TONSILLECTOMY      OB History    Gravida Para Term Preterm AB Living   0 0 0 0 0 0   SAB TAB Ectopic Multiple Live Births   0 0 0 0         Home Medications    Prior to Admission medications   Medication Sig Start Date End Date Taking? Authorizing Provider  albuterol (PROVENTIL HFA;VENTOLIN HFA) 108 (90 BASE) MCG/ACT inhaler Inhale 2 puffs into the lungs every 6 (six) hours as needed for wheezing or shortness of breath.    Historical Provider, MD  ibuprofen (ADVIL,MOTRIN) 600 MG tablet Take 1 tablet (600 mg total) by mouth every 6 (six) hours as needed for moderate pain. 05/17/16   Evon Slackhomas C Katryna Tschirhart, PA-C  ranitidine (ZANTAC) 150 MG tablet Take 1 tablet (150 mg total) by mouth 2 (two) times daily. Patient not taking: Reported on 06/21/2015 02/09/15 02/09/16  Darien Ramusavid W Kaminski, MD    Family History No family history on file.  Social History Social History  Substance Use Topics  . Smoking status: Never Smoker  . Smokeless tobacco: Never  Used  . Alcohol use No     Allergies   Review of patient's allergies indicates no known allergies.   Review of Systems Review of Systems  Constitutional: Negative for activity change, chills, fatigue and fever.  HENT: Negative for congestion, sinus pressure and sore throat.   Eyes: Negative for visual disturbance.  Respiratory: Negative for cough, chest tightness and shortness of breath.   Cardiovascular: Negative for chest pain and leg swelling.  Gastrointestinal: Negative for abdominal pain, diarrhea, nausea and vomiting.  Genitourinary: Negative for dysuria.  Musculoskeletal: Positive for arthralgias and myalgias. Negative for gait problem, joint swelling and neck pain.  Skin: Negative for rash.  Neurological: Negative for weakness, numbness and headaches.  Hematological: Negative for adenopathy.  Psychiatric/Behavioral: Negative for agitation, behavioral problems and confusion.     Physical Exam Updated Vital Signs BP (!) 160/74 (BP Location: Left Arm)   Pulse 71   Temp 98.1 F (36.7 C) (Oral)   Resp 20   Ht 5\' 10"  (1.778 m)   Wt (!) 168.7 kg   LMP 04/18/2016   SpO2 98%   BMI 53.38 kg/m   Physical Exam  Constitutional: She is oriented to person, place, and time. She appears well-developed and well-nourished. No distress.  HENT:  Head: Normocephalic and atraumatic.  Mouth/Throat: Oropharynx is clear and moist.  Eyes:  EOM are normal. Pupils are equal, round, and reactive to light. Right eye exhibits no discharge. Left eye exhibits no discharge.  Neck: Normal range of motion. Neck supple.  Cardiovascular: Normal rate, regular rhythm and intact distal pulses.   Pulmonary/Chest: Effort normal and breath sounds normal. No respiratory distress. She exhibits no tenderness.  Abdominal: Soft. She exhibits no distension. There is no tenderness.  Musculoskeletal:  Examination of the right shoulder shows the patient has full active and passive range of motion with internal,  external rotation, abduction, flexion. She is tender along the anterior shoulder along before meals joint. She has positive Hawkins test, negative impingement test. She has full flexion and extension of the elbow. Full range of motion of the wrist and digits. She is neurovascularly intact right upper extremity.  Neurological: She is alert and oriented to person, place, and time. She has normal reflexes.  Skin: Skin is warm and dry.  Psychiatric: She has a normal mood and affect. Her behavior is normal. Thought content normal.     ED Treatments / Results  Labs (all labs ordered are listed, but only abnormal results are displayed) Labs Reviewed - No data to display  EKG  EKG Interpretation None       Radiology Dg Shoulder Right  Result Date: 05/17/2016 CLINICAL DATA:  Right shoulder pain, hit a wall today EXAM: RIGHT SHOULDER - 2+ VIEW COMPARISON:  None. FINDINGS: Three views of the right shoulder submitted. No acute fracture or subluxation. AC joint and glenohumeral joint are preserved. IMPRESSION: Negative. Electronically Signed   By: Natasha MeadLiviu  Pop M.D.   On: 05/17/2016 21:15    Procedures Procedures (including critical care time) SPLINT APPLICATION Date/Time: 9:48 PM Authorized by: Patience MuscaGAINES, Pavielle Biggar CHRISTOPHER Consent: Verbal consent obtained. Risks and benefits: risks, benefits and alternatives were discussed Consent given by: patient Splint applied by: ED tech Location details: Right shoulder  Splint type: Right sling  Supplies used: Sling right shoulder  Post-procedure: The splinted body part was neurovascularly unchanged following the procedure. Patient tolerance: Patient tolerated the procedure well with no immediate complications.     Medications Ordered in ED Medications - No data to display   Initial Impression / Assessment and Plan / ED Course  I have reviewed the triage vital signs and the nursing notes.  Pertinent labs & imaging results that were available  during my care of the patient were reviewed by me and considered in my medical decision making (see chart for details).  Clinical Course    20 year old female with right shoulder contusion. She will ice and rest the shoulder. Ibuprofen 600 mg as needed for pain every 6 hours. She is given a sling for comfort for the next few days. She will transition out of for 3 days and follow-up with orthopedics if no improvement in 5-7 days.  Final Clinical Impressions(s) / ED Diagnoses   Final diagnoses:  Right shoulder pain  Contusion of bone    New Prescriptions New Prescriptions   IBUPROFEN (ADVIL,MOTRIN) 600 MG TABLET    Take 1 tablet (600 mg total) by mouth every 6 (six) hours as needed for moderate pain.     Evon Slackhomas C Lenward Able, PA-C 05/17/16 2149    Sharman CheekPhillip Stafford, MD 05/17/16 2322

## 2016-05-17 NOTE — Discharge Instructions (Signed)
Please wear sling for a few days as needed for pain. Discontinue sling after 3 days. Rest and ice shoulder. Take ibuprofen as needed for pain. Follow-up with orthopedics if no improvement in 5-7 days.

## 2016-05-17 NOTE — ED Notes (Signed)
Reviewed d/c instructions, follow-up care, use of ice/elevation with pt. Pt verbalized understanding. 

## 2016-05-17 NOTE — ED Notes (Addendum)
Pt states she thinks she hit her right anterior shoulder on door when she was removing laundry from a machine approx 3 hours pta. Pt with cms intact to right fingers. 3+ right radial pulse noted. Pt self splinting by holding arm to chest. Ice applied. Pt denies shob, nausea, chest pain. Skin normal color warm and dry.

## 2016-05-17 NOTE — ED Triage Notes (Signed)
"  I did something to my right shoulder."  Patient reports pain started approximately 2 hours ago after she hit the wall.

## 2016-10-27 IMAGING — CR DG CHEST 2V
1 series · 2 of 2 positions shown · non-contrast
Comparison: 02/09/2015

CLINICAL DATA: Chest pain radiating into the jaw.

EXAM:
CHEST  2 VIEW

[Series 1: dg chest 2 view · 0.14mm/px · 2 of 2 slices shown]
[im 1/2]
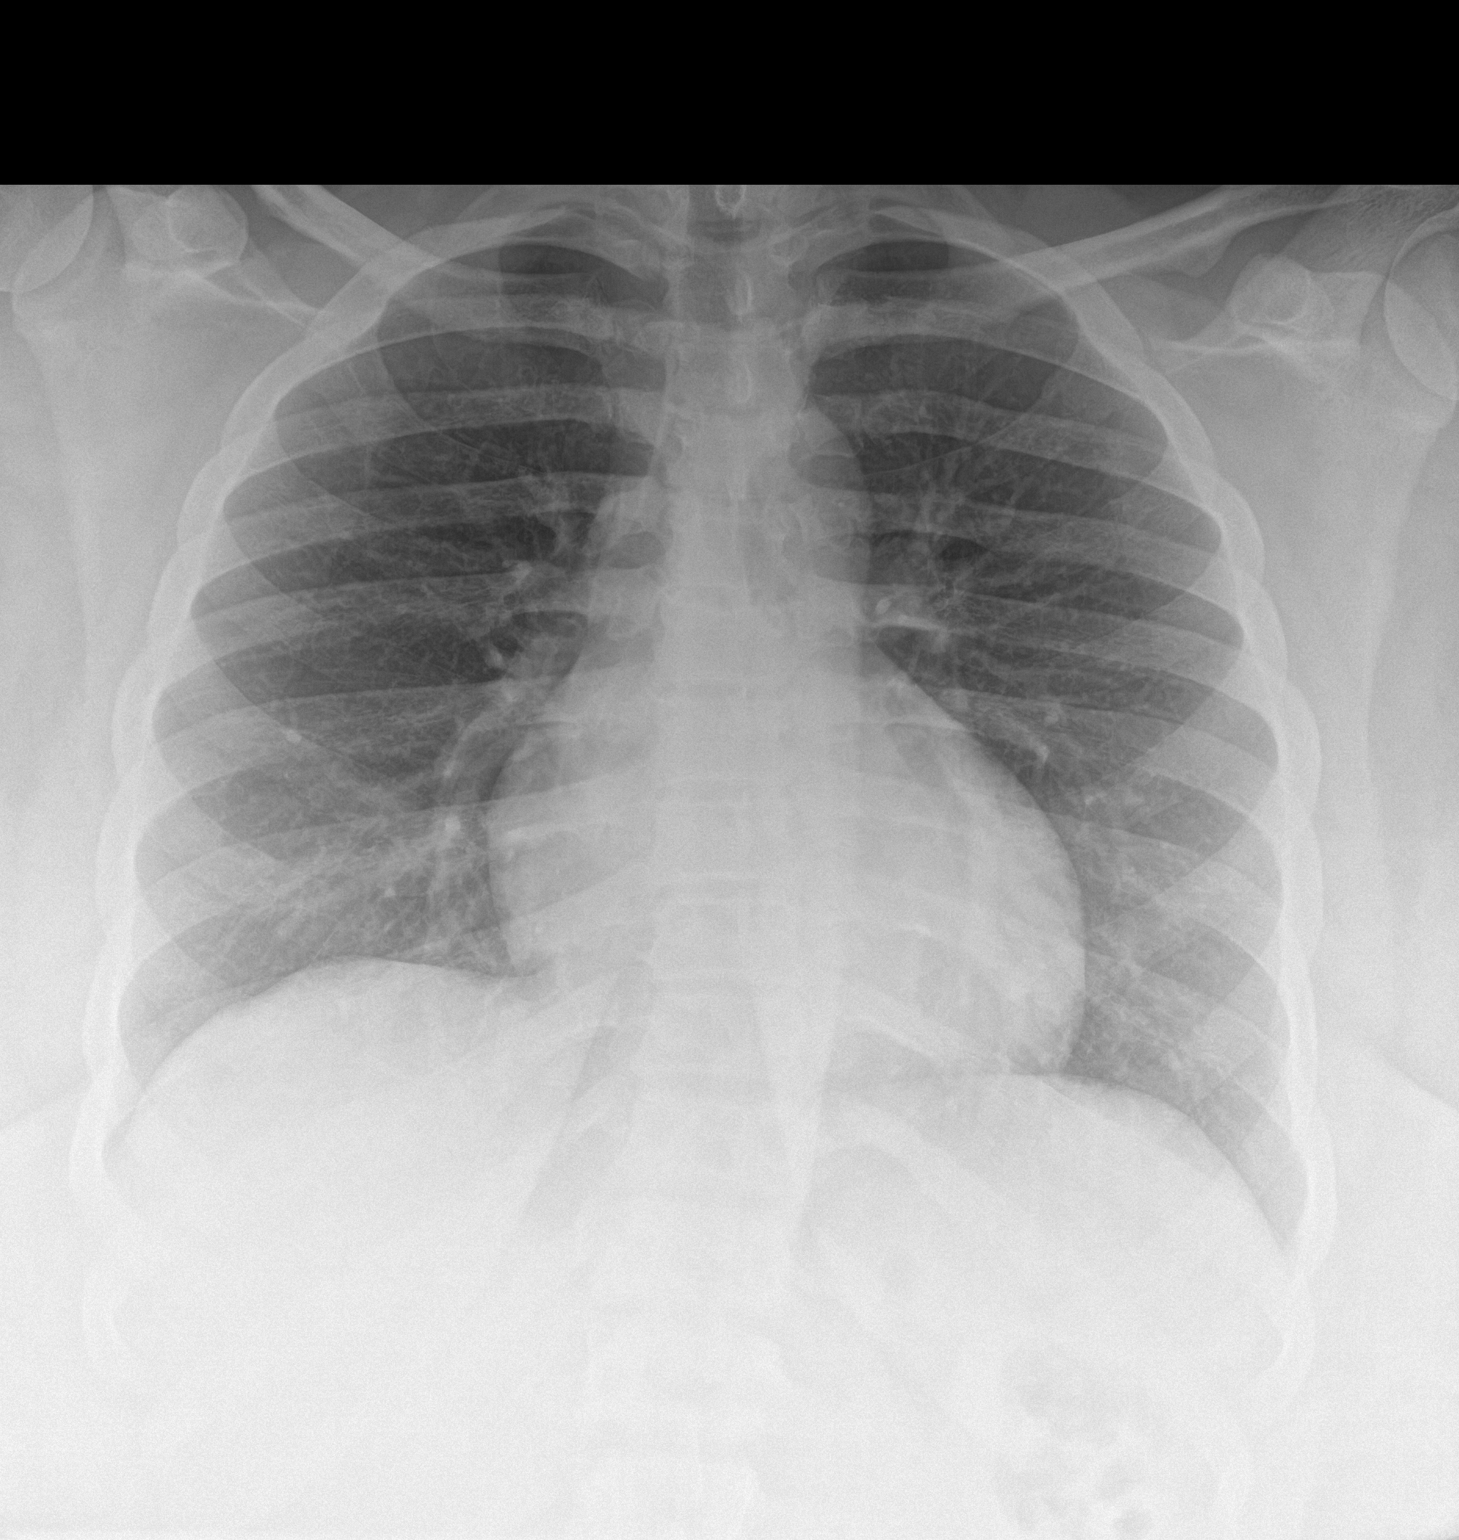
[im 2/2]
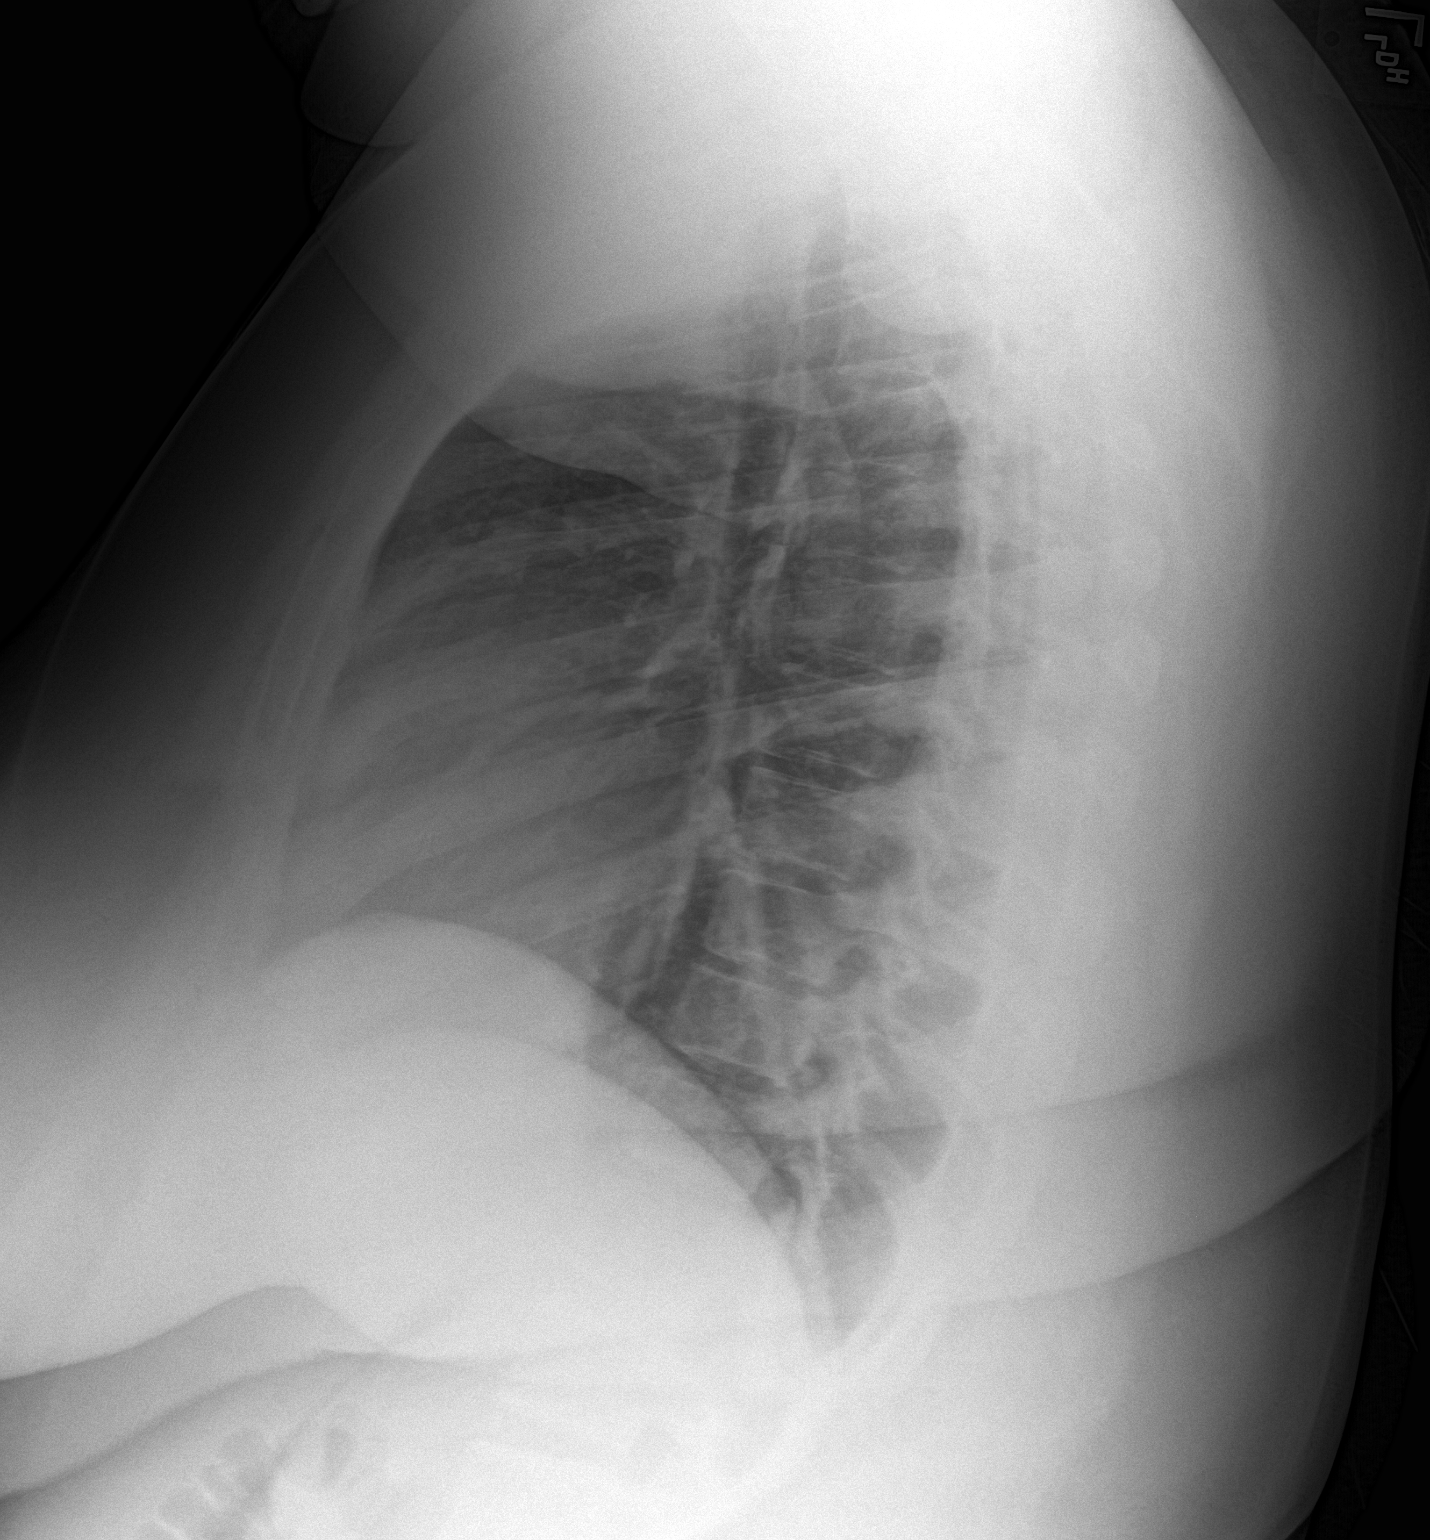

[2 of 2 positions shown; findings below may reference images not displayed]

FINDINGS: The heart size and mediastinal contours are within normal limits.
Both lungs are clear. The visualized skeletal structures are
unremarkable.
IMPRESSION: No active cardiopulmonary disease.

## 2017-08-21 ENCOUNTER — Encounter: Payer: Self-pay | Admitting: *Deleted

## 2017-08-21 ENCOUNTER — Emergency Department
Admission: EM | Admit: 2017-08-21 | Discharge: 2017-08-22 | Disposition: A | Discharge status: Home / Self Care | Payer: Medicaid Other | Attending: Emergency Medicine | Admitting: Emergency Medicine

## 2017-08-21 ENCOUNTER — Other Ambulatory Visit: Payer: Self-pay

## 2017-08-21 DIAGNOSIS — E860 Dehydration: Secondary | ICD-10-CM | POA: Diagnosis not present

## 2017-08-21 DIAGNOSIS — Z79899 Other long term (current) drug therapy: Secondary | ICD-10-CM | POA: Insufficient documentation

## 2017-08-21 DIAGNOSIS — I10 Essential (primary) hypertension: Secondary | ICD-10-CM | POA: Diagnosis not present

## 2017-08-21 DIAGNOSIS — R112 Nausea with vomiting, unspecified: Secondary | ICD-10-CM

## 2017-08-21 LAB — COMPREHENSIVE METABOLIC PANEL
ALK PHOS: 68 U/L (ref 38–126)
ALT: 20 U/L (ref 14–54)
AST: 22 U/L (ref 15–41)
Albumin: 3.8 g/dL (ref 3.5–5.0)
Anion gap: 8 (ref 5–15)
BUN: 14 mg/dL (ref 6–20)
CALCIUM: 8.9 mg/dL (ref 8.9–10.3)
CO2: 25 mmol/L (ref 22–32)
CREATININE: 0.98 mg/dL (ref 0.44–1.00)
Chloride: 103 mmol/L (ref 101–111)
Glucose, Bld: 116 mg/dL — ABNORMAL HIGH (ref 65–99)
Potassium: 3.8 mmol/L (ref 3.5–5.1)
Sodium: 136 mmol/L (ref 135–145)
Total Bilirubin: 0.8 mg/dL (ref 0.3–1.2)
Total Protein: 7.6 g/dL (ref 6.5–8.1)

## 2017-08-21 LAB — CBC
HCT: 34 % — ABNORMAL LOW (ref 35.0–47.0)
Hemoglobin: 11.3 g/dL — ABNORMAL LOW (ref 12.0–16.0)
MCH: 27 pg (ref 26.0–34.0)
MCHC: 33.3 g/dL (ref 32.0–36.0)
MCV: 81.1 fL (ref 80.0–100.0)
PLATELETS: 402 10*3/uL (ref 150–440)
RBC: 4.19 MIL/uL (ref 3.80–5.20)
RDW: 19 % — AB (ref 11.5–14.5)
WBC: 9.8 10*3/uL (ref 3.6–11.0)

## 2017-08-21 LAB — HCG, QUANTITATIVE, PREGNANCY

## 2017-08-21 LAB — LIPASE, BLOOD: Lipase: 22 U/L (ref 11–51)

## 2017-08-21 MED ORDER — SODIUM CHLORIDE 0.9 % IV BOLUS (SEPSIS)
1000.0000 mL | Freq: Once | INTRAVENOUS | Status: AC
  Administered 2017-08-21: 1000 mL via INTRAVENOUS

## 2017-08-21 MED ORDER — MORPHINE SULFATE (PF) 4 MG/ML IV SOLN
4.0000 mg | Freq: Once | INTRAVENOUS | Status: AC
  Administered 2017-08-21: 4 mg via INTRAVENOUS
  Filled 2017-08-21: qty 1

## 2017-08-21 MED ORDER — ONDANSETRON HCL 4 MG/2ML IJ SOLN
4.0000 mg | Freq: Once | INTRAMUSCULAR | Status: AC | PRN
  Administered 2017-08-21: 4 mg via INTRAVENOUS
  Filled 2017-08-21: qty 2

## 2017-08-21 MED ORDER — HALOPERIDOL LACTATE 5 MG/ML IJ SOLN
2.5000 mg | Freq: Once | INTRAMUSCULAR | Status: AC
  Administered 2017-08-21: 2.5 mg via INTRAVENOUS
  Filled 2017-08-21: qty 1

## 2017-08-21 MED ORDER — ONDANSETRON 4 MG PO TBDP: 4.0000 mg | ORAL_TABLET | Freq: Three times a day (TID) | ORAL | 0 refills | Status: AC | PRN

## 2017-08-21 NOTE — ED Notes (Signed)
First Nurse Note: Pt c/o vomiting. She was shopping and began to get abd pain and vomiting.

## 2017-08-21 NOTE — ED Provider Notes (Signed)
[facility] Emergency Department Provider Note  ____________________________________________   First MD Initiated Contact with Patient 08/21/17 2117     (approximate)  I have reviewed the triage vital signs and the nursing notes.   HISTORY  Chief Complaint Nausea and Weakness   HPI Kim Little is a 21 y.o. female who self presents the emergency department with generalized malaise that began around 1 PM roughly 8 hours prior to arrival.  She has diffuse moderate severity abdominal discomfort and nausea.  She is vomited several times.  Her symptoms are improved after vomiting.  She has had no diarrhea.  She has had difficulty keeping food down.  Last menstrual period was November 3.  Pain is nonradiating.  Past Medical History:  Diagnosis Date  . Asthma   . Hypertension     There are no active problems to display for this patient.   Past Surgical History:  Procedure Laterality Date  . ADENOIDECTOMY    . TONSILLECTOMY      Prior to Admission medications   Medication Sig Start Date End Date Taking? Authorizing Provider  albuterol (PROVENTIL HFA;VENTOLIN HFA) 108 (90 BASE) MCG/ACT inhaler Inhale 2 puffs into the lungs every 6 (six) hours as needed for wheezing or shortness of breath.   Yes [provider]  FLUoxetine (PROZAC) 10 MG capsule Take 10 mg by mouth daily. 08/11/17  Yes [provider]  TARINA FE 1/20 EQ 1-20 MG-MCG tablet Take 1 tablet by mouth daily. 08/10/17  Yes [provider]  traZODone (DESYREL) 50 MG tablet Take 50 mg by mouth at bedtime. 07/28/17  Yes [provider]  ondansetron (ZOFRAN ODT) 4 MG disintegrating tablet Take 1 tablet (4 mg total) by mouth every 8 (eight) hours as needed for nausea or vomiting. 08/21/17   Merrily Brittleifenbark, Meshelle Holness, MD  ranitidine (ZANTAC) 150 MG tablet Take 1 tablet (150 mg total) by mouth 2 (two) times daily. Patient not taking: Reported on 06/21/2015 02/09/15 02/09/16   Darien RamusKaminski, David W, MD    Allergies Lisdexamfetamine  History reviewed. No pertinent family history.  Social History Social History   Tobacco Use  . Smoking status: Never Smoker  . Smokeless tobacco: Never Used  Substance Use Topics  . Alcohol use: No  . Drug use: No    Review of Systems Constitutional: No fever/chills Eyes: No visual changes. ENT: No sore throat. Cardiovascular: Denies chest pain. Respiratory: Denies shortness of breath. Gastrointestinal: Positive for abdominal pain.  Positive for nausea, positive for vomiting.  Positive for diarrhea.  No constipation. Genitourinary: Negative for dysuria. Musculoskeletal: Negative for back pain. Skin: Negative for rash. Neurological: Negative for headaches, focal weakness or numbness.   ____________________________________________   PHYSICAL EXAM:  VITAL SIGNS: ED Triage Vitals  Enc Vitals Group     BP 08/21/17 2057 (!) 143/64     Pulse Rate 08/21/17 2057 71     Resp 08/21/17 2057 20     Temp 08/21/17 2057 97.7 F (36.5 C)     Temp Source 08/21/17 2057 Oral     SpO2 08/21/17 2057 100 %     Weight 08/21/17 2058 (!) 350 lb (158.8 kg)     Height 08/21/17 2058 5\' 10"  (1.778 m)     Head Circumference --      Peak Flow --      Pain Score 08/21/17 2057 9     Pain Loc --      Pain Edu? --      Excl. in  GC? --     Constitutional: Alert and oriented x4 tearful and uncomfortable appearing hyperventilating no diaphoresis Eyes: PERRL EOMI. Head: Atraumatic. Nose: No congestion/rhinnorhea. Mouth/Throat: No trismus Neck: No stridor.   Cardiovascular: Normal rate, regular rhythm. Grossly normal heart sounds.  Good peripheral circulation. Respiratory: Increased respiratory effort no retractions. Lungs CTAB and moving good air Gastrointestinal: Obese soft mild diffuse tenderness with no focality no rebound or guarding no peritonitis Musculoskeletal: No lower extremity edema   Neurologic:  Normal speech and language.  No gross focal neurologic deficits are appreciated. Skin:  Skin is warm, dry and intact. No rash noted. Psychiatric: Mood and affect are normal. Speech and behavior are normal.    ____________________________________________   DIFFERENTIAL includes but not limited to  Gastroenteritis, viral syndrome, influenza, appendicitis, diverticulitis, ectopic pregnancy, infectious diarrhea ____________________________________________   LABS (all labs ordered are listed, but only abnormal results are displayed)  Labs Reviewed  COMPREHENSIVE METABOLIC PANEL - Abnormal; Notable for the following components:      Result Value   Glucose, Bld 116 (*)    All other components within normal limits  CBC - Abnormal; Notable for the following components:   Hemoglobin 11.3 (*)    HCT 34.0 (*)    RDW 19.0 (*)    All other components within normal limits  LIPASE, BLOOD  HCG, QUANTITATIVE, PREGNANCY    Blood work reviewed by me shows no acute disease and she __________________________________________  EKG   ____________________________________________  RADIOLOGY   ____________________________________________   PROCEDURES  Procedure(s) performed: no  Procedures  Critical Care performed: no  Observation: no ____________________________________________   INITIAL IMPRESSION / ASSESSMENT AND PLAN / ED COURSE  Pertinent labs & imaging results that were available during my care of the patient were reviewed by me and considered in my medical decision making (see chart for details).  On arrival the patient is hyperventilating and tearful appearing although with a relatively benign abdominal exam.  She has nausea vomiting and diarrhea with myalgias which is most consistent with a viral syndrome and viral gastroenteritis.  Will begin with haloperidol low-dose for pain and nausea as well as morphine and reevaluate.  After medications and hydration the patient's symptoms are nearly completely  resolved.  Her abdomen remains benign and she is able to eat.  I had a lengthy discussion with the patient and her family at bedside regarding the diagnosis and the predicted clinical course of being sick for 24-48 hours more.  Strict return precautions given and the patient verbalized understanding and agreement the plan.      ____________________________________________   FINAL CLINICAL IMPRESSION(S) / ED DIAGNOSES  Final diagnoses:  Dehydration  Nausea and vomiting, intractability of vomiting not specified, unspecified vomiting type      NEW MEDICATIONS STARTED DURING THIS VISIT:  This SmartLink is deprecated. Use AVSMEDLIST instead to display the medication list for a patient.   Note:  This document was prepared using Dragon voice recognition software and may include unintentional dictation errors.     Merrily Brittleifenbark, Kalynne Womac, MD 08/22/17 [phone]

## 2017-08-21 NOTE — ED Triage Notes (Signed)
Pt to ED reporting nausea and vomiting x 3 throughout the day. PT denies having fevers at home. Pt denies diarrhea but pt verbalize having stomach pain at this time. Pt denies hx of abd complications. While in triage pt reports feeling faint and became tearful. Pt is lying back in a recliner and reports feeling like she is abut to have a syncopal episode.

## 2017-08-21 NOTE — Discharge Instructions (Signed)
Please make sure you remain well-hydrated and use your nausea medication as needed for severe symptoms.  I anticipate that your symptoms should last up to 48 hours, however if they last longer than that or you develop a fever or chills or if you cannot keep food down please to come back to the emergency department for recheck.  It was a pleasure to take care of you today, and thank you for coming to our emergency department.  If you have any questions or concerns before leaving please ask the nurse to grab me and I'm more than happy to go through your aftercare instructions again.  If you were prescribed any opioid pain medication today such as Norco, Vicodin, Percocet, morphine, hydrocodone, or oxycodone please make sure you do not drive when you are taking this medication as it can alter your ability to drive safely.  If you have any concerns once you are home that you are not improving or are in fact getting worse before you can make it to your follow-up appointment, please do not hesitate to call [phone] and come back for further evaluation.  Merrily BrittleNeil Arlys Scatena, MD  Results for orders placed or performed during the hospital encounter of 08/21/17  Lipase, blood  Result Value Ref Range   Lipase 22 11 - 51 U/L  Comprehensive metabolic panel  Result Value Ref Range   Sodium 136 135 - 145 mmol/L   Potassium 3.8 3.5 - 5.1 mmol/L   Chloride 103 101 - 111 mmol/L   CO2 25 22 - 32 mmol/L   Glucose, Bld 116 (H) 65 - 99 mg/dL   BUN 14 6 - 20 mg/dL   Creatinine, Ser 4.090.98 0.44 - 1.00 mg/dL   Calcium 8.9 8.9 - 81.110.3 mg/dL   Total Protein 7.6 6.5 - 8.1 g/dL   Albumin 3.8 3.5 - 5.0 g/dL   AST 22 15 - 41 U/L   ALT 20 14 - 54 U/L   Alkaline Phosphatase 68 38 - 126 U/L   Total Bilirubin 0.8 0.3 - 1.2 mg/dL   GFR calc non Af Amer >60 >60 mL/min   GFR calc Af Amer >60 >60 mL/min   Anion gap 8 5 - 15  CBC  Result Value Ref Range   WBC 9.8 3.6 - 11.0 K/uL   RBC 4.19 3.80 - 5.20 MIL/uL   Hemoglobin 11.3 (L)  12.0 - 16.0 g/dL   HCT 91.434.0 (L) 78.235.0 - 95.647.0 %   MCV 81.1 80.0 - 100.0 fL   MCH 27.0 26.0 - 34.0 pg   MCHC 33.3 32.0 - 36.0 g/dL   RDW 21.319.0 (H) 08.611.5 - 57.814.5 %   Platelets 402 150 - 440 K/uL  hCG, quantitative, pregnancy  Result Value Ref Range   hCG, Beta Chain, Quant, S <1 <5 mIU/mL

## 2017-08-22 NOTE — ED Notes (Signed)

## 2017-09-23 IMAGING — CR DG SHOULDER 2+V*R*
1 series · 4 of 4 positions shown · non-contrast
Comparison: None.

CLINICAL DATA: Right shoulder pain, hit a wall today

EXAM:
RIGHT SHOULDER - 2+ VIEW

[Series 1: dg shoulder right · 0.14mm/px · 4 of 4 slices shown]
[im 1/4]
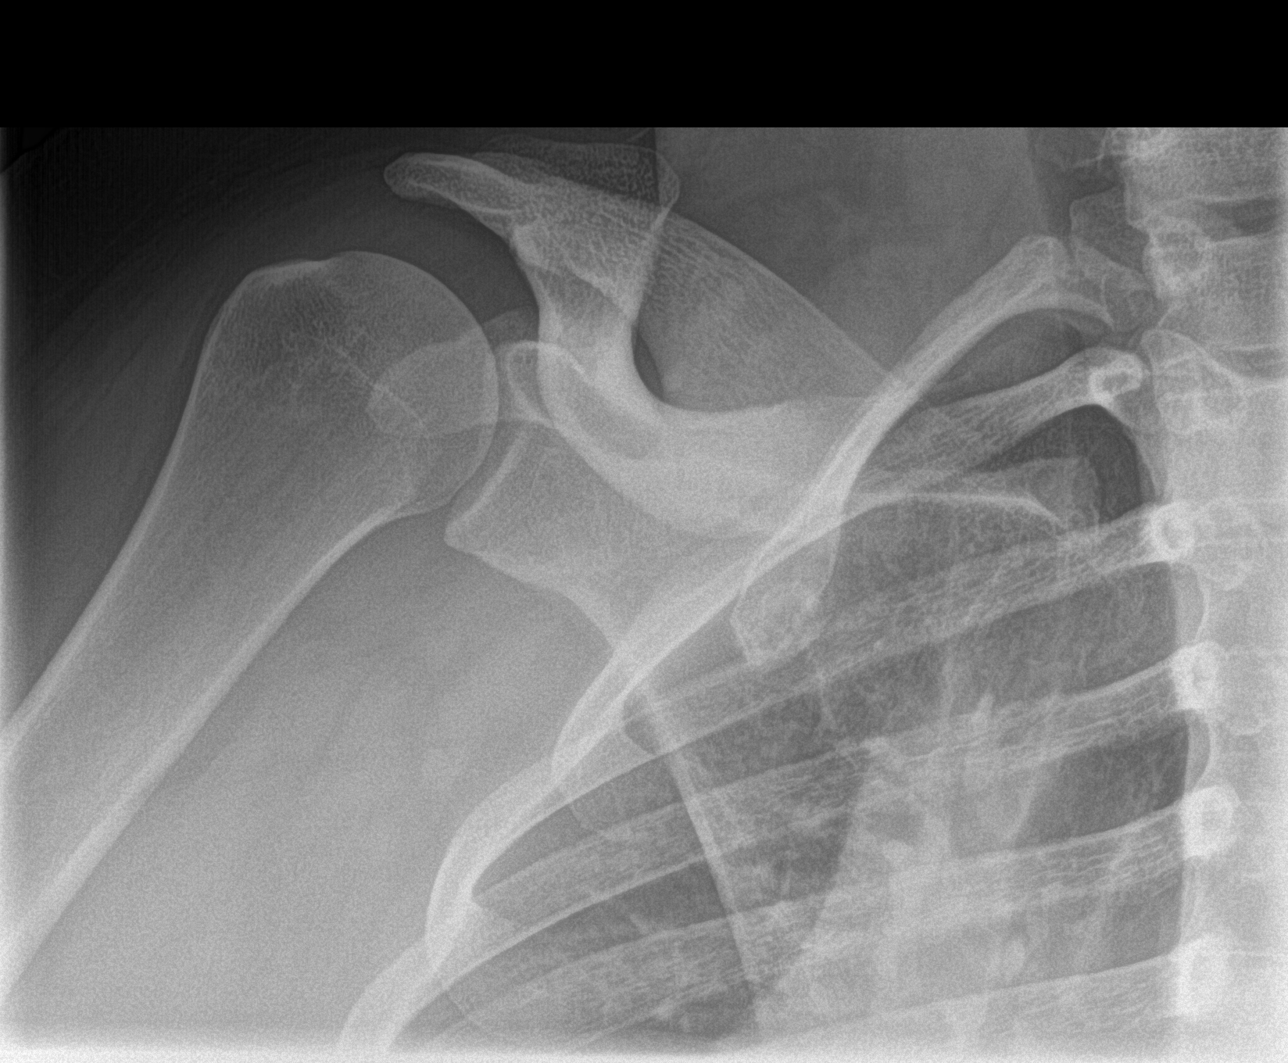
[im 2/4]
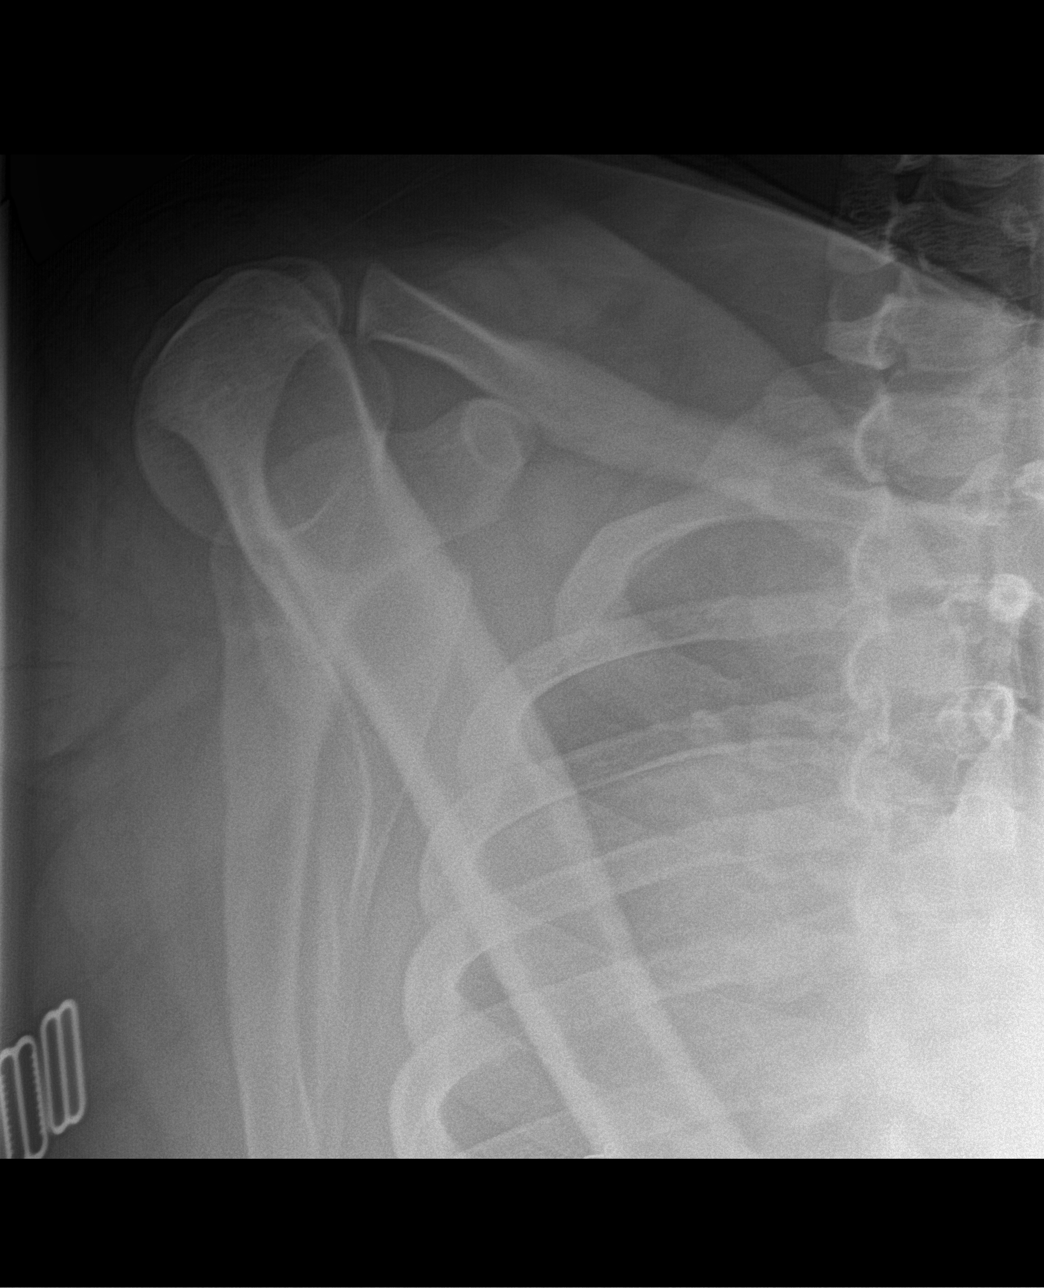
[im 3/4]
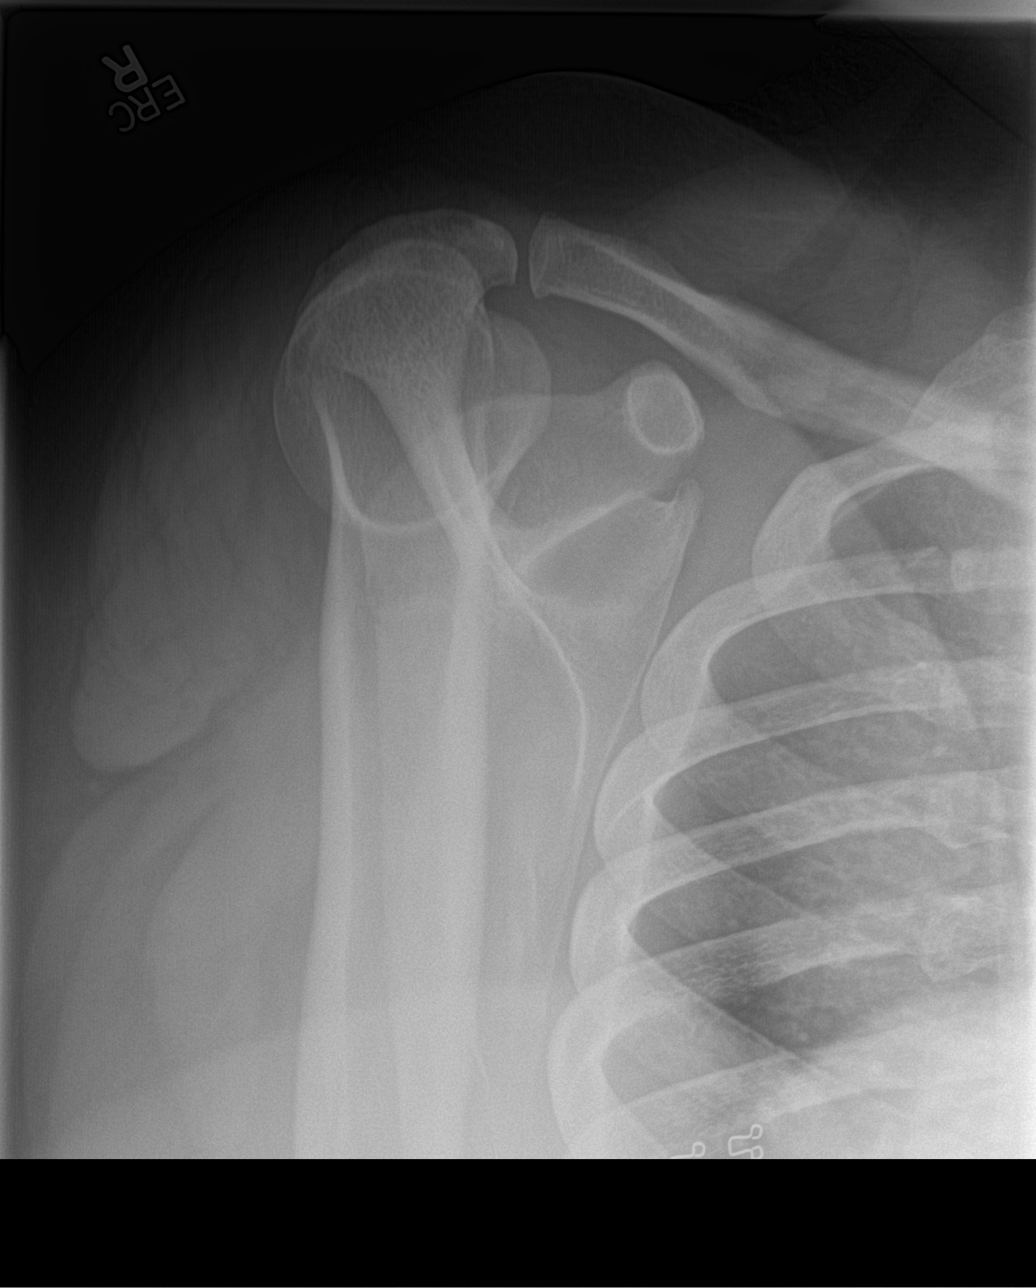
[im 4/4]
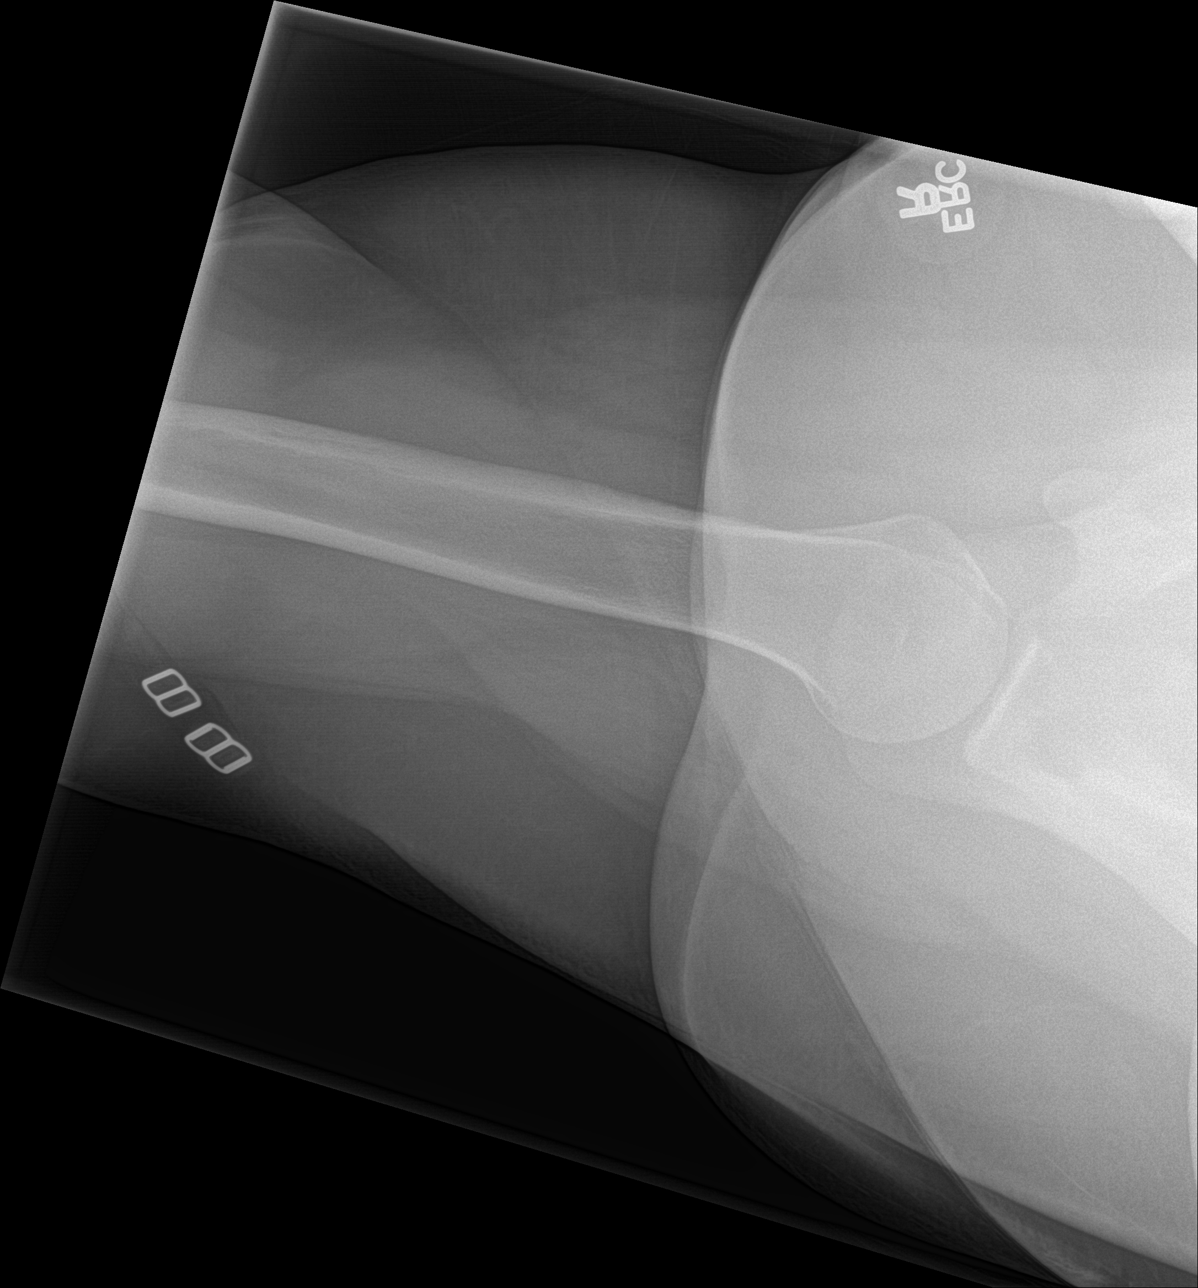

[4 of 4 positions shown; findings below may reference images not displayed]

FINDINGS: Three views of the right shoulder submitted. No acute fracture or
subluxation. AC joint and glenohumeral joint are preserved.
IMPRESSION: Negative.

## 2019-10-22 HISTORY — PX: GASTROPLASTY DUODENAL SWITCH: SHX1699

## 2020-06-14 ENCOUNTER — Emergency Department
Admission: EM | Admit: 2020-06-14 | Discharge: 2020-06-14 | Disposition: A | Discharge status: Home / Self Care | Payer: 59 | Attending: Emergency Medicine | Admitting: Emergency Medicine

## 2020-06-14 ENCOUNTER — Other Ambulatory Visit: Payer: Self-pay

## 2020-06-14 DIAGNOSIS — R42 Dizziness and giddiness: Secondary | ICD-10-CM | POA: Diagnosis present

## 2020-06-14 DIAGNOSIS — R55 Syncope and collapse: Secondary | ICD-10-CM | POA: Diagnosis not present

## 2020-06-14 DIAGNOSIS — Z7951 Long term (current) use of inhaled steroids: Secondary | ICD-10-CM | POA: Diagnosis not present

## 2020-06-14 DIAGNOSIS — I10 Essential (primary) hypertension: Secondary | ICD-10-CM | POA: Insufficient documentation

## 2020-06-14 DIAGNOSIS — J45909 Unspecified asthma, uncomplicated: Secondary | ICD-10-CM | POA: Insufficient documentation

## 2020-06-14 LAB — URINALYSIS, COMPLETE (UACMP) WITH MICROSCOPIC
Bacteria, UA: NONE SEEN
Bilirubin Urine: NEGATIVE
Glucose, UA: NEGATIVE mg/dL
Hgb urine dipstick: NEGATIVE
Ketones, ur: 5 mg/dL — AB
Leukocytes,Ua: NEGATIVE
Nitrite: NEGATIVE
Protein, ur: NEGATIVE mg/dL
Specific Gravity, Urine: 1.018 (ref 1.005–1.030)
pH: 5 (ref 5.0–8.0)

## 2020-06-14 LAB — POCT PREGNANCY, URINE: Preg Test, Ur: NEGATIVE

## 2020-06-14 LAB — CBC
HCT: 31.6 % — ABNORMAL LOW (ref 36.0–46.0)
Hemoglobin: 10.4 g/dL — ABNORMAL LOW (ref 12.0–15.0)
MCH: 26.7 pg (ref 26.0–34.0)
MCHC: 32.9 g/dL (ref 30.0–36.0)
MCV: 81 fL (ref 80.0–100.0)
Platelets: 431 10*3/uL — ABNORMAL HIGH (ref 150–400)
RBC: 3.9 MIL/uL (ref 3.87–5.11)
RDW: 18.6 % — ABNORMAL HIGH (ref 11.5–15.5)
WBC: 6.1 10*3/uL (ref 4.0–10.5)
nRBC: 0 % (ref 0.0–0.2)

## 2020-06-14 LAB — BASIC METABOLIC PANEL
Anion gap: 10 (ref 5–15)
BUN: 7 mg/dL (ref 6–20)
CO2: 25 mmol/L (ref 22–32)
Calcium: 9 mg/dL (ref 8.9–10.3)
Chloride: 104 mmol/L (ref 98–111)
Creatinine, Ser: 0.7 mg/dL (ref 0.44–1.00)
GFR calc Af Amer: >60 mL/min (ref >60)
GFR calc non Af Amer: >60 mL/min (ref >60)
Glucose, Bld: 87 mg/dL (ref 70–99)
Potassium: 3.8 mmol/L (ref 3.5–5.1)
Sodium: 139 mmol/L (ref 135–145)

## 2020-06-14 NOTE — ED Notes (Signed)
Pt alert and oriented X 4, stable for discharge. RR even and unlabored, color WNL. Discussed discharge instructions and follow-up as directed. Discharge medications discussed if provided. Pt had opportunity to ask questions if necessary and RN to provide patient/family eduction.  

## 2020-06-14 NOTE — ED Triage Notes (Signed)
Pt states has had several episodes of near syncope over last day. Pt denies pain, denies fever, covid exposure. Pt denies nausea.

## 2020-06-14 NOTE — ED Provider Notes (Signed)
[facility] Emergency Department Provider Note   ____________________________________________   First MD Initiated Contact with Patient 06/14/20 1313     (approximate)  I have reviewed the triage vital signs and the nursing notes.   HISTORY  Chief Complaint Near Syncope    HPI Kim Little is a 24 y.o. female with a stated past medical history of asthma and recent bariatric surgery approximately 7 months ago who presents for lightheadedness upon arising from a seated position or getting up from laying down.  Patient states the symptoms been occurring over the last 4-5 days.  Patient states that she may not be getting enough p.o. intake due to her recent bariatric surgery.  Patient states that she had her mother do "orthostatic vital signs" on her yesterday and had a systolic drop of approximately 30 points when she was sitting down versus standing up.  Patient states that the symptoms also occur when she is bending over and partially relieved after some time or if she sits down.  Patient denies any chest pain, shortness of breath, full loss of consciousness, vision changes, tinnitus, or weakness/numbness/paresthesias in any extremity.         Past Medical History:  Diagnosis Date  . Asthma   . Hypertension     There are no problems to display for this patient.   Past Surgical History:  Procedure Laterality Date  . ADENOIDECTOMY    . TONSILLECTOMY      Prior to Admission medications   Medication Sig Start Date End Date Taking? Authorizing Provider  albuterol (PROVENTIL HFA;VENTOLIN HFA) 108 (90 BASE) MCG/ACT inhaler Inhale 2 puffs into the lungs every 6 (six) hours as needed for wheezing or shortness of breath.    [provider]  FLUoxetine (PROZAC) 10 MG capsule Take 10 mg by mouth daily. 08/11/17   [provider]  ondansetron (ZOFRAN ODT) 4 MG disintegrating tablet Take 1 tablet (4 mg total) by mouth every 8 (eight) hours  as needed for nausea or vomiting. 08/21/17   Merrily Brittle, MD  ranitidine (ZANTAC) 150 MG tablet Take 1 tablet (150 mg total) by mouth 2 (two) times daily. Patient not taking: Reported on 06/21/2015 02/09/15 02/09/16  Darien Ramus, MD  TARINA FE 1/20 EQ 1-20 MG-MCG tablet Take 1 tablet by mouth daily. 08/10/17   [provider]  traZODone (DESYREL) 50 MG tablet Take 50 mg by mouth at bedtime. 07/28/17   [provider]    Allergies Lisdexamfetamine  No family history on file.  Social History Social History   Tobacco Use  . Smoking status: Never Smoker  . Smokeless tobacco: Never Used  Substance Use Topics  . Alcohol use: No  . Drug use: No    Review of Systems Constitutional: No fever/chills Eyes: No visual changes. ENT: No sore throat. Cardiovascular: Denies chest pain. Respiratory: Denies shortness of breath. Gastrointestinal: No abdominal pain.  No nausea, no vomiting.  No diarrhea. Genitourinary: Negative for dysuria. Musculoskeletal: Negative for acute arthralgias Skin: Negative for rash. Neurological: Negative for headaches, weakness/numbness/paresthesias in any extremity Psychiatric: Negative for suicidal ideation/homicidal ideation   ____________________________________________   PHYSICAL EXAM:  VITAL SIGNS: ED Triage Vitals [06/14/20 1039]  Enc Vitals Group     BP 116/70     Pulse Rate 73     Resp 16     Temp 99 F (37.2 C)     Temp Source Oral     SpO2 100 %     Weight  248 lb (112.5 kg)     Height 5\' 9"  (1.753 m)     Head Circumference      Peak Flow      Pain Score 0     Pain Loc      Pain Edu?      Excl. in GC?    Constitutional: Alert and oriented. Well appearing and in no acute distress. Eyes: Conjunctivae are normal. PERRL. EOMI. Head: Atraumatic. Nose: No congestion/rhinnorhea. Mouth/Throat: Mucous membranes are moist. Neck: No stridor Cardiovascular: Normal rate, regular rhythm. Grossly normal heart sounds.   Good peripheral circulation. Respiratory: Normal respiratory effort.  No retractions. Gastrointestinal: Soft and nontender. No distention. Musculoskeletal: No lower extremity tenderness nor edema.  No joint effusions. Neurologic:  Normal speech and language. No gross focal neurologic deficits are appreciated. Skin:  Skin is warm and dry. No rash noted. Psychiatric: Mood and affect are normal. Speech and behavior are normal.  ____________________________________________   LABS (all labs ordered are listed, but only abnormal results are displayed)  Labs Reviewed  CBC - Abnormal; Notable for the following components:      Result Value   Hemoglobin 10.4 (*)    HCT 31.6 (*)    RDW 18.6 (*)    Platelets 431 (*)    All other components within normal limits  URINALYSIS, COMPLETE (UACMP) WITH MICROSCOPIC - Abnormal; Notable for the following components:   Color, Urine YELLOW (*)    APPearance HAZY (*)    Ketones, ur 5 (*)    All other components within normal limits  BASIC METABOLIC PANEL  POCT PREGNANCY, URINE  POC URINE PREG, ED   ____________________________________________  EKG  ED ECG REPORT I, , the attending physician, personally viewed and interpreted this ECG.  Date: 06/14/2020 EKG Time: 1044 Rate: 62 Rhythm: normal sinus rhythm QRS Axis: normal Intervals: normal ST/T Wave abnormalities: normal Narrative Interpretation: no evidence of acute ischemia ________________________________   PROCEDURES  Procedure(s) performed (including Critical Care):  Procedures   ____________________________________________   INITIAL IMPRESSION / ASSESSMENT AND PLAN / ED COURSE      Patient presents with complaints of syncope/presyncope ED Workup:  CBC, BMP, Troponin, BNP, ECG, CXR Differential diagnosis includes HF, ICH, seizure, stroke, HOCM, ACS, aortic dissection, malignant arrhythmia, or GI bleed. Findings: No evidence of acute laboratory abnormalities.   Troponin negative x1 EKG: No e/o STEMI. No evidence of Brugadas sign, delta wave, epsilon wave, significantly prolonged QTc, or malignant arrhythmia.  Disposition: Discharge. Patient is at baseline at this time. Return precautions expressed and understood in person. Advised follow up with primary care provider or clinic physician in next 24 hours.      ____________________________________________   FINAL CLINICAL IMPRESSION(S) / ED DIAGNOSES  Final diagnoses:  Near syncope  Orthostatic lightheadedness     ED Discharge Orders    None       Note:  This document was prepared using Dragon voice recognition software and may include unintentional dictation errors.   06/16/2020, MD 06/14/20 1359

## 2020-06-14 NOTE — ED Notes (Signed)
Reports repeated intermittent dizziness and "feeling faint" with movement since Sunday. Denies pain when occurs.

## 2020-08-07 ENCOUNTER — Encounter (HOSPITAL_COMMUNITY): Payer: Self-pay | Admitting: *Deleted

## 2020-08-07 ENCOUNTER — Other Ambulatory Visit: Payer: Self-pay

## 2020-08-07 ENCOUNTER — Ambulatory Visit (HOSPITAL_COMMUNITY)
Admission: EM | Admit: 2020-08-07 | Discharge: 2020-08-07 | Disposition: A | Discharge status: Home / Self Care | Payer: BLUE CROSS/BLUE SHIELD | Attending: Family Medicine | Admitting: Family Medicine

## 2020-08-07 DIAGNOSIS — H66001 Acute suppurative otitis media without spontaneous rupture of ear drum, right ear: Secondary | ICD-10-CM

## 2020-08-07 DIAGNOSIS — Z3202 Encounter for pregnancy test, result negative: Secondary | ICD-10-CM | POA: Diagnosis not present

## 2020-08-07 LAB — POC URINE PREG, ED: Preg Test, Ur: NEGATIVE

## 2020-08-07 MED ORDER — AMOXICILLIN-POT CLAVULANATE 875-125 MG PO TABS: 1.0000 | ORAL_TABLET | Freq: Two times a day (BID) | ORAL | 0 refills | Status: AC

## 2020-08-07 MED ORDER — KETOROLAC TROMETHAMINE 60 MG/2ML IM SOLN
60.0000 mg | Freq: Once | INTRAMUSCULAR | Status: AC
  Administered 2020-08-07: 60 mg via INTRAMUSCULAR

## 2020-08-07 MED ORDER — KETOROLAC TROMETHAMINE 60 MG/2ML IM SOLN
INTRAMUSCULAR | Status: AC
  Filled 2020-08-07: qty 2

## 2020-08-07 MED ORDER — IBUPROFEN 800 MG PO TABS: 800.0000 mg | ORAL_TABLET | Freq: Three times a day (TID) | ORAL | 0 refills | Status: AC

## 2020-08-07 NOTE — Discharge Instructions (Addendum)
Medicines as prescribed You can also take additional tylenol as needed in addition to the ibuprofen for pain.  Follow up as needed for continued or worsening symptoms

## 2020-08-07 NOTE — ED Triage Notes (Signed)
Pt reports rt ear and face pain started  4 days ago. Pt also has congestion to Rt side of nose

## 2020-08-08 NOTE — ED Provider Notes (Signed)
[facility]    CSN: [id number] Arrival date & time: 08/07/20  1028      History   Chief Complaint Chief Complaint  Patient presents with  . Otalgia    RT   . Facial Pain    RT    HPI Kim Little is a 24 y.o. female.   Patient is a 24 year old female who presents today with right ear pain with radiation to facial area.  This is been present worsening over the past 3 days.  Denies any drainage from the ear.  Has having some muffled hearing.  No fevers, chills, nasal congestion, rhinorrhea, cough.     Past Medical History:  Diagnosis Date  . Asthma   . Hypertension     There are no problems to display for this patient.   Past Surgical History:  Procedure Laterality Date  . ADENOIDECTOMY    . TONSILLECTOMY      OB History    Gravida  0   Para  0   Term  0   Preterm  0   AB  0   Living  0     SAB  0   TAB  0   Ectopic  0   Multiple  0   Live Births               Home Medications    Prior to Admission medications   Medication Sig Start Date End Date Taking? Authorizing Provider  albuterol (PROVENTIL HFA;VENTOLIN HFA) 108 (90 BASE) MCG/ACT inhaler Inhale 2 puffs into the lungs every 6 (six) hours as needed for wheezing or shortness of breath.   Yes [provider]  ondansetron (ZOFRAN ODT) 4 MG disintegrating tablet Take 1 tablet (4 mg total) by mouth every 8 (eight) hours as needed for nausea or vomiting. 08/21/17  Yes Merrily Brittle, MD  traZODone (DESYREL) 50 MG tablet Take 50 mg by mouth at bedtime. 07/28/17  Yes [provider]  amoxicillin-clavulanate (AUGMENTIN) 875-125 MG tablet Take 1 tablet by mouth every 12 (twelve) hours. 08/07/20   Dahlia Byes A, NP  FLUoxetine (PROZAC) 10 MG capsule Take 10 mg by mouth daily. 08/11/17   [provider]  ibuprofen (ADVIL) 800 MG tablet Take 1 tablet (800 mg total) by mouth 3 (three) times daily. 08/07/20   Dahlia Byes A, NP  ranitidine (ZANTAC) 150 MG  tablet Take 1 tablet (150 mg total) by mouth 2 (two) times daily. Patient not taking: Reported on 06/21/2015 02/09/15 02/09/16  Darien Ramus, MD  TARINA FE 1/20 EQ 1-20 MG-MCG tablet Take 1 tablet by mouth daily. 08/10/17   [provider]    Family History History reviewed. No pertinent family history.  Social History Social History   Tobacco Use  . Smoking status: Never Smoker  . Smokeless tobacco: Never Used  Substance Use Topics  . Alcohol use: No  . Drug use: No     Allergies   Lisdexamfetamine   Review of Systems Review of Systems   Physical Exam Triage Vital Signs ED Triage Vitals  Enc Vitals Group     BP 08/07/20 1219 131/66     Pulse Rate 08/07/20 1219 94     Resp 08/07/20 1219 18     Temp 08/07/20 1219 97.8 F (36.6 C)     Temp Source 08/07/20 1219 Oral     SpO2 08/07/20 1219 99 %     Weight 08/07/20 1226 223 lb 11.2 oz (101.5 kg)  Height 08/07/20 1226 5\' 9"  (1.753 m)     Head Circumference --      Peak Flow --      Pain Score 08/07/20 1226 10     Pain Loc --      Pain Edu? --      Excl. in GC? --    No data found.  Updated Vital Signs BP 131/66 (BP Location: Right Arm)   Pulse 94   Temp 97.8 F (36.6 C) (Oral)   Resp 18   Ht 5\' 9"  (1.753 m)   Wt 223 lb 11.2 oz (101.5 kg)   LMP 07/31/2020   SpO2 99%   BMI 33.03 kg/m   Visual Acuity Right Eye Distance:   Left Eye Distance:   Bilateral Distance:    Right Eye Near:   Left Eye Near:    Bilateral Near:     Physical Exam Vitals and nursing note reviewed.  Constitutional:      General: She is not in acute distress.    Appearance: Normal appearance. She is not ill-appearing, toxic-appearing or diaphoretic.  HENT:     Head: Normocephalic.     Right Ear: Decreased hearing noted.     Ears:     Comments: Mucoid TM with surrounding erythema.     Nose: Nose normal.  Eyes:     Conjunctiva/sclera: Conjunctivae normal.  Pulmonary:     Effort: Pulmonary effort is normal.    Musculoskeletal:        General: Normal range of motion.     Cervical back: Normal range of motion.  Skin:    General: Skin is warm and dry.     Findings: No rash.  Neurological:     Mental Status: She is alert.  Psychiatric:        Mood and Affect: Mood normal.      UC Treatments / Results  Labs (all labs ordered are listed, but only abnormal results are displayed) Labs Reviewed  POC URINE PREG, ED    EKG   Radiology No results found.  Procedures Procedures (including critical care time)  Medications Ordered in UC Medications  ketorolac (TORADOL) injection 60 mg (60 mg Intramuscular Given 08/07/20 1254)    Initial Impression / Assessment and Plan / UC Course  I have reviewed the triage vital signs and the nursing notes.  Pertinent labs & imaging results that were available during my care of the patient were reviewed by me and considered in my medical decision making (see chart for details).     Otitis media Treating with amoxicillin Toradol given for pain. Ibuprofen  as needed at home. Final Clinical Impressions(s) / UC Diagnoses   Final diagnoses:  Non-recurrent acute suppurative otitis media of right ear without spontaneous rupture of tympanic membrane     Discharge Instructions     Medicines as prescribed You can also take additional tylenol as needed in addition to the ibuprofen for pain.  Follow up as needed for continued or worsening symptoms     ED Prescriptions    Medication Sig Dispense Auth. Provider   amoxicillin-clavulanate (AUGMENTIN) 875-125 MG tablet Take 1 tablet by mouth every 12 (twelve) hours. 14 tablet Alexios Keown A, NP   ibuprofen (ADVIL) 800 MG tablet Take 1 tablet (800 mg total) by mouth 3 (three) times daily. 21 tablet Montford Barg A, NP     PDMP not reviewed this encounter.   13/10/2019, NP 08/08/20 [phone]

## 2020-09-26 ENCOUNTER — Emergency Department
Admission: EM | Admit: 2020-09-26 | Discharge: 2020-09-26 | Disposition: A | Discharge status: Home / Self Care | Payer: BLUE CROSS/BLUE SHIELD | Attending: Emergency Medicine | Admitting: Emergency Medicine

## 2020-09-26 ENCOUNTER — Encounter: Payer: Self-pay | Admitting: Emergency Medicine

## 2020-09-26 ENCOUNTER — Other Ambulatory Visit: Payer: Self-pay

## 2020-09-26 DIAGNOSIS — R519 Headache, unspecified: Secondary | ICD-10-CM | POA: Insufficient documentation

## 2020-09-26 DIAGNOSIS — N390 Urinary tract infection, site not specified: Secondary | ICD-10-CM | POA: Insufficient documentation

## 2020-09-26 DIAGNOSIS — I10 Essential (primary) hypertension: Secondary | ICD-10-CM | POA: Diagnosis not present

## 2020-09-26 DIAGNOSIS — G43009 Migraine without aura, not intractable, without status migrainosus: Secondary | ICD-10-CM

## 2020-09-26 DIAGNOSIS — J45909 Unspecified asthma, uncomplicated: Secondary | ICD-10-CM | POA: Diagnosis not present

## 2020-09-26 LAB — URINALYSIS, COMPLETE (UACMP) WITH MICROSCOPIC
Bilirubin Urine: NEGATIVE
Glucose, UA: NEGATIVE mg/dL
Hgb urine dipstick: NEGATIVE
Ketones, ur: NEGATIVE mg/dL
Nitrite: POSITIVE — AB
Protein, ur: NEGATIVE mg/dL
Specific Gravity, Urine: 1.015 (ref 1.005–1.030)
WBC, UA: >50 WBC/hpf — ABNORMAL HIGH (ref 0–5)
pH: 5 (ref 5.0–8.0)

## 2020-09-26 LAB — CBC
HCT: 28.7 % — ABNORMAL LOW (ref 36.0–46.0)
Hemoglobin: 9.5 g/dL — ABNORMAL LOW (ref 12.0–15.0)
MCH: 26.9 pg (ref 26.0–34.0)
MCHC: 33.1 g/dL (ref 30.0–36.0)
MCV: 81.3 fL (ref 80.0–100.0)
Platelets: 372 10*3/uL (ref 150–400)
RBC: 3.53 MIL/uL — ABNORMAL LOW (ref 3.87–5.11)
RDW: 19 % — ABNORMAL HIGH (ref 11.5–15.5)
WBC: 4.7 10*3/uL (ref 4.0–10.5)
nRBC: 0 % (ref 0.0–0.2)

## 2020-09-26 LAB — BASIC METABOLIC PANEL
Anion gap: 8 (ref 5–15)
BUN: 5 mg/dL — ABNORMAL LOW (ref 6–20)
CO2: 24 mmol/L (ref 22–32)
Calcium: 8.7 mg/dL — ABNORMAL LOW (ref 8.9–10.3)
Chloride: 105 mmol/L (ref 98–111)
Creatinine, Ser: 0.8 mg/dL (ref 0.44–1.00)
GFR, Estimated: >60 mL/min (ref >60)
Glucose, Bld: 89 mg/dL (ref 70–99)
Potassium: 3.7 mmol/L (ref 3.5–5.1)
Sodium: 137 mmol/L (ref 135–145)

## 2020-09-26 LAB — POC URINE PREG, ED: Preg Test, Ur: NEGATIVE

## 2020-09-26 MED ORDER — METHYLPREDNISOLONE SODIUM SUCC 125 MG IJ SOLR
125.0000 mg | Freq: Once | INTRAMUSCULAR | Status: AC
  Administered 2020-09-26: 125 mg via INTRAVENOUS
  Filled 2020-09-26: qty 2

## 2020-09-26 MED ORDER — KETOROLAC TROMETHAMINE 30 MG/ML IJ SOLN
30.0000 mg | Freq: Once | INTRAMUSCULAR | Status: AC
  Administered 2020-09-26: 30 mg via INTRAVENOUS
  Filled 2020-09-26: qty 1

## 2020-09-26 MED ORDER — BUTALBITAL-APAP-CAFFEINE 50-325-40 MG PO TABS: 1.0000 | ORAL_TABLET | Freq: Four times a day (QID) | ORAL | 0 refills | Status: AC | PRN

## 2020-09-26 MED ORDER — METOCLOPRAMIDE HCL 5 MG/ML IJ SOLN
10.0000 mg | Freq: Once | INTRAMUSCULAR | Status: AC
  Administered 2020-09-26: 10 mg via INTRAVENOUS
  Filled 2020-09-26: qty 2

## 2020-09-26 MED ORDER — DIPHENHYDRAMINE HCL 50 MG/ML IJ SOLN
50.0000 mg | Freq: Once | INTRAMUSCULAR | Status: AC
  Administered 2020-09-26: 50 mg via INTRAVENOUS
  Filled 2020-09-26: qty 1

## 2020-09-26 MED ORDER — SODIUM CHLORIDE 0.9 % IV BOLUS
1000.0000 mL | Freq: Once | INTRAVENOUS | Status: AC
  Administered 2020-09-26: 1000 mL via INTRAVENOUS

## 2020-09-26 MED ORDER — FOSFOMYCIN TROMETHAMINE 3 G PO PACK
3.0000 g | PACK | Freq: Once | ORAL | Status: AC
  Administered 2020-09-26: 3 g via ORAL
  Filled 2020-09-26: qty 3

## 2020-09-26 NOTE — ED Provider Notes (Signed)
[facility] Emergency Department Provider Note  Time seen: 10:28 AM  I have reviewed the triage vital signs and the nursing notes.   HISTORY  Chief Complaint Headache   HPI Kim Little is a 24 y.o. female with a past medical history of asthma, hypertension, presents to the emergency department for headache.  According to the patient over the past 2 weeks she has been experiencing a headache mostly behind her right eye.  States pain with loud noises or bright lights.  States nausea at times.  Denies any vomiting.  Denies any fever.  Denies any weakness or numbness of any arm or leg confusion or slurred speech.  Patient states she used to get more severe headaches but has not had any recently.  Largely negative review of systems otherwise.   Past Medical History:  Diagnosis Date  . Asthma   . Hypertension     There are no problems to display for this patient.   Past Surgical History:  Procedure Laterality Date  . ADENOIDECTOMY    . TONSILLECTOMY      Prior to Admission medications   Medication Sig Start Date End Date Taking? Authorizing Provider  albuterol (PROVENTIL HFA;VENTOLIN HFA) 108 (90 BASE) MCG/ACT inhaler Inhale 2 puffs into the lungs every 6 (six) hours as needed for wheezing or shortness of breath.    [provider]  amoxicillin-clavulanate (AUGMENTIN) 875-125 MG tablet Take 1 tablet by mouth every 12 (twelve) hours. 08/07/20   Dahlia Byes A, NP  FLUoxetine (PROZAC) 10 MG capsule Take 10 mg by mouth daily. 08/11/17   [provider]  ibuprofen (ADVIL) 800 MG tablet Take 1 tablet (800 mg total) by mouth 3 (three) times daily. 08/07/20   Bast, Gloris Manchester A, NP  ondansetron (ZOFRAN ODT) 4 MG disintegrating tablet Take 1 tablet (4 mg total) by mouth every 8 (eight) hours as needed for nausea or vomiting. 08/21/17   Merrily Brittle, MD  ranitidine (ZANTAC) 150 MG tablet Take 1 tablet (150 mg total) by mouth 2 (two) times  daily. Patient not taking: Reported on 06/21/2015 02/09/15 02/09/16  Darien Ramus, MD  TARINA FE 1/20 EQ 1-20 MG-MCG tablet Take 1 tablet by mouth daily. 08/10/17   [provider]  traZODone (DESYREL) 50 MG tablet Take 50 mg by mouth at bedtime. 07/28/17   [provider]    Allergies  Allergen Reactions  . Lisdexamfetamine Other (See Comments)    agitation    No family history on file.  Social History Social History   Tobacco Use  . Smoking status: Never Smoker  . Smokeless tobacco: Never Used  Substance Use Topics  . Alcohol use: No  . Drug use: No    Review of Systems Constitutional: Negative for fever. Cardiovascular: Negative for chest pain. Respiratory: Negative for shortness of breath. Gastrointestinal: Negative for abdominal pain, vomiting Musculoskeletal: Negative for musculoskeletal complaints Neurological: Positive for moderate dull aching headache mostly right-sided per patient. All other ROS negative  ____________________________________________   PHYSICAL EXAM:  VITAL SIGNS: ED Triage Vitals  Enc Vitals Group     BP 09/26/20 0919 130/77     Pulse Rate 09/26/20 0919 80     Resp 09/26/20 0919 17     Temp 09/26/20 0919 98.7 F (37.1 C)     Temp Source 09/26/20 0919 Oral     SpO2 09/26/20 0919 100 %     Weight 09/26/20 0858 219 lb (99.3 kg)     Height 09/26/20 0858  5\' 9"  (1.753 m)     Head Circumference --      Peak Flow --      Pain Score 09/26/20 0858 8     Pain Loc --      Pain Edu? --      Excl. in GC? --    Constitutional: Alert and oriented. Well appearing and in no distress. Eyes: Mild photophobia otherwise normal exam.  PERRL ENT      Head: Normocephalic and atraumatic.      Mouth/Throat: Mucous membranes are moist. Cardiovascular: Normal rate, regular rhythm. No murmur Respiratory: Normal respiratory effort without tachypnea nor retractions. Breath sounds are clear  Gastrointestinal: Soft and nontender. No  distention.   Musculoskeletal: Nontender with normal range of motion in all extremities. Neurologic:  Normal speech and language. No gross focal neurologic deficits.  Equal grip strengths.  No pronator drift.  5/5 motor in all extremities. Skin:  Skin is warm, dry and intact.  Psychiatric: Mood and affect are normal.   ____________________________________________    EKG  EKG viewed and interpreted by myself shows a normal sinus rhythm at 77 bpm with a narrow QRS, normal axis, normal intervals, no concerning ST changes.  ____________________________________________   INITIAL IMPRESSION / ASSESSMENT AND PLAN / ED COURSE  Pertinent labs & imaging results that were available during my care of the patient were reviewed by me and considered in my medical decision making (see chart for details).   Patient presents to the emergency department for headache that has been ongoing for the past 2 weeks.  Patient states a history of headaches but is not had 1 recently per patient.  Appears to be only right-sided along with photo and phonophobia.  Headache is suggestive of a migraine.  Patient states she was prescribed sumatriptan by an urgent care but it did not help.  We will dose Toradol Reglan Benadryl and Solu-Medrol as well as IV fluids and reassess.  Patient's labs are largely within normal limits, exam largely within normal limits besides photophobia.  No neurologic findings.  Urinalysis has resulted showing urinary tract infection we will dose a one-time dose of fosfomycin and send a urine culture.  No urinary symptoms.  Patient states she feels much better after medications.  Rates her headache is minimal.  We will discharge with a prescription for Fioricet to be used if needed.  Discussed PCP follow-up as well as return precautions.  Patient agreeable to plan of care.  Kim Little was evaluated in Emergency Department on 09/26/2020 for the symptoms described in the history of present  illness. She was evaluated in the context of the global COVID-19 pandemic, which necessitated consideration that the patient might be at risk for infection with the SARS-CoV-2 virus that causes COVID-19. Institutional protocols and algorithms that pertain to the evaluation of patients at risk for COVID-19 are in a state of rapid change based on information released by regulatory bodies including the CDC and federal and state organizations. These policies and algorithms were followed during the patient's care in the ED.  ____________________________________________   FINAL CLINICAL IMPRESSION(S) / ED DIAGNOSES  Headache Urinary tract infection   09/28/2020, MD 09/26/20 1150

## 2020-09-26 NOTE — Discharge Instructions (Signed)
Please follow-up with your doctor for recheck/reevaluation.  Return to the emergency department for any return of significant headache, fever, or any other symptom personally concerning to yourself.  Otherwise please take your prescribed headache medication as needed, as written.

## 2020-09-26 NOTE — ED Triage Notes (Signed)
Patient to ER for c/o HA x2 weeks with lightheadedness. Saw urgent care at that time, was told they believed it was migraine. Has had OTC medications without relief.

## 2020-09-28 LAB — URINE CULTURE: Culture: >=100000 — AB

## 2020-10-13 ENCOUNTER — Other Ambulatory Visit: Payer: Self-pay

## 2020-10-16 ENCOUNTER — Emergency Department
Admission: EM | Admit: 2020-10-16 | Discharge: 2020-10-16 | Disposition: A | Discharge status: Home / Self Care | Payer: BLUE CROSS/BLUE SHIELD | Attending: Emergency Medicine | Admitting: Emergency Medicine

## 2020-10-16 ENCOUNTER — Other Ambulatory Visit: Payer: Self-pay

## 2020-10-16 ENCOUNTER — Emergency Department: Payer: BLUE CROSS/BLUE SHIELD

## 2020-10-16 ENCOUNTER — Encounter: Payer: Self-pay | Admitting: Emergency Medicine

## 2020-10-16 DIAGNOSIS — R079 Chest pain, unspecified: Secondary | ICD-10-CM | POA: Diagnosis not present

## 2020-10-16 DIAGNOSIS — Z5321 Procedure and treatment not carried out due to patient leaving prior to being seen by health care provider: Secondary | ICD-10-CM | POA: Diagnosis not present

## 2020-10-16 DIAGNOSIS — U099 Post covid-19 condition, unspecified: Secondary | ICD-10-CM | POA: Diagnosis not present

## 2020-10-16 DIAGNOSIS — R059 Cough, unspecified: Secondary | ICD-10-CM | POA: Insufficient documentation

## 2020-10-16 NOTE — ED Triage Notes (Signed)
Pt reports woke up this am with a cough and CP that worsens when she coughs or takes a deep breath. Pt reports had coivid 2 weeks ago.

## 2020-10-17 ENCOUNTER — Ambulatory Visit (LOCAL_COMMUNITY_HEALTH_CENTER): Payer: Medicaid Other

## 2020-10-17 ENCOUNTER — Other Ambulatory Visit: Payer: Self-pay

## 2020-10-17 DIAGNOSIS — Z111 Encounter for screening for respiratory tuberculosis: Secondary | ICD-10-CM

## 2020-10-18 ENCOUNTER — Other Ambulatory Visit: Payer: Self-pay

## 2020-10-18 ENCOUNTER — Encounter: Payer: Self-pay | Admitting: Physician Assistant

## 2020-10-18 ENCOUNTER — Ambulatory Visit (LOCAL_COMMUNITY_HEALTH_CENTER): Payer: Medicaid Other | Admitting: Physician Assistant

## 2020-10-18 VITALS — BP 134/77 | Ht 71.0 in | Wt 211.0 lb

## 2020-10-18 DIAGNOSIS — Z30016 Encounter for initial prescription of transdermal patch hormonal contraceptive device: Secondary | ICD-10-CM

## 2020-10-18 DIAGNOSIS — Z3009 Encounter for other general counseling and advice on contraception: Secondary | ICD-10-CM | POA: Diagnosis not present

## 2020-10-18 DIAGNOSIS — F319 Bipolar disorder, unspecified: Secondary | ICD-10-CM

## 2020-10-18 DIAGNOSIS — Z01419 Encounter for gynecological examination (general) (routine) without abnormal findings: Secondary | ICD-10-CM | POA: Diagnosis not present

## 2020-10-18 DIAGNOSIS — F329 Major depressive disorder, single episode, unspecified: Secondary | ICD-10-CM

## 2020-10-18 DIAGNOSIS — Z113 Encounter for screening for infections with a predominantly sexual mode of transmission: Secondary | ICD-10-CM

## 2020-10-18 LAB — WET PREP FOR TRICH, YEAST, CLUE
Trichomonas Exam: NEGATIVE
Yeast Exam: NEGATIVE

## 2020-10-18 MED ORDER — XULANE 150-35 MCG/24HR TD PTWK: 1.0000 | MEDICATED_PATCH | TRANSDERMAL | 12 refills | Status: DC

## 2020-10-18 NOTE — Progress Notes (Unsigned)
Pt desires physical, STD checks, and wants to discuss birth control options. Pt does not remember ever having a PAP smear. Pt is requesting help with getting mental health services, desires to get back on the medications she was taking.

## 2020-10-18 NOTE — Progress Notes (Signed)
Wet mount reviewed, no tx per standing order. Provider orders completed. 

## 2020-10-19 ENCOUNTER — Encounter: Payer: Self-pay | Admitting: Physician Assistant

## 2020-10-19 ENCOUNTER — Ambulatory Visit (LOCAL_COMMUNITY_HEALTH_CENTER): Payer: Medicaid Other

## 2020-10-19 DIAGNOSIS — Z111 Encounter for screening for respiratory tuberculosis: Secondary | ICD-10-CM

## 2020-10-19 LAB — TB SKIN TEST
Induration: 0 mm
TB Skin Test: NEGATIVE

## 2020-10-19 NOTE — Progress Notes (Signed)
[facility] DEPARTMENT [facility] [address] Main Number: [phone]    Family Planning Visit- Initial Visit  Subjective:  Kim Little is a 25 y.o.  G0P0000   being seen today for an initial well woman visit and to discuss family planning options.  She is currently using None for pregnancy prevention. Patient reports she does not want a pregnancy in the next year.  Patient has the following medical conditions has ADHD (attention deficit hyperactivity disorder) on their problem list.  Chief Complaint  Patient presents with  . Contraception    PE and wants to discuss options    Patient reports that she has a history of ADD/ADHD, Bipolar disorder, MDD and Asthma.  Reports that she previously also had prediabetes and HTN but that has resolved since she has lost over 100 lbs after gastric bypass surgery.  PHQ-9=16; patient states that she has had passing thoughts of suicide but never any type of plan.  States that she would like to establish with a mental health provider to discuss restarting medicines for bipolar disorder and depression.  Reports that she will sometimes get yeast infections related to her period and easily gets UTIs.  Reports headaches are usually stress related and relieved with Tylenol and rest.  Patient states that she tried OCs in the past but did not like them, so would like to try something different to help with her period's heavy flow and cramping.   Patient denies any other concerns.    Body mass index is 29.43 kg/m. - Patient is eligible for diabetes screening based on BMI and age >22?  not applicable HA1C ordered? not applicable  Patient reports 2  partner/s in last year. Desires STI screening?  Yes  Has patient been screened once for HCV in the past?  No  No results found for: HCVAB  Does the patient have current drug use (including MJ), have a partner with drug use, and/or has been incarcerated since last result?  No  If yes-- Screen for HCV through [facility]   Does the patient meet criteria for HBV testing? No  Criteria:  -Household, sexual or needle sharing contact with HBV -History of drug use -HIV positive -Those with known Hep C   Health Maintenance Due  Topic Date Due  . Hepatitis C Screening  Never done  . HIV Screening  Never done  . PAP-Cervical Cytology Screening  Never done  . PAP SMEAR-Modifier  Never done  . INFLUENZA VACCINE  04/30/2020  . COVID-19 Vaccine (3 - Booster for Moderna series) 06/11/2020    Review of Systems  All other systems reviewed and are negative.   The following portions of the patient's history were reviewed and updated as appropriate: allergies, current medications, past family history, past medical history, past social history, past surgical history and problem list. Problem list updated.   See flowsheet for other program required questions.  Objective:   Vitals:   10/18/20 1342  BP: 134/77  Weight: 211 lb (95.7 kg)  Height: 5\' 11"  (1.803 m)    Physical Exam Vitals and nursing note reviewed.  Constitutional:      General: She is not in acute distress.    Appearance: Normal appearance.  HENT:     Head: Normocephalic and atraumatic.     Mouth/Throat:     Mouth: Mucous membranes are moist.     Pharynx: Oropharynx is clear. No oropharyngeal exudate or posterior oropharyngeal erythema.  Eyes:  Conjunctiva/sclera: Conjunctivae normal.  Neck:     Thyroid: No thyroid mass, thyromegaly or thyroid tenderness.  Cardiovascular:     Rate and Rhythm: Normal rate and regular rhythm.  Pulmonary:     Effort: Pulmonary effort is normal.     Breath sounds: Normal breath sounds.  Chest:  Breasts:     Right: Normal. No mass, nipple discharge, skin change, tenderness, axillary adenopathy or supraclavicular adenopathy.     Left: Normal. No mass, nipple discharge, skin change, tenderness, axillary adenopathy or supraclavicular adenopathy.     Abdominal:     Palpations: Abdomen is soft. There is no mass.     Tenderness: There is no abdominal tenderness. There is no guarding or rebound.  Genitourinary:    General: Normal vulva.     Rectum: Normal.     Comments: External genitalia/pubic area without nits, lice, edema, erythema, lesions and inguinal adenopathy. Vagina with normal mucosa and discharge. Cervix without visible lesions. Uterus firm, mobile, nt, no masses, no CMT, no adnexal tenderness or fullness. Musculoskeletal:     Cervical back: Neck supple. No tenderness.  Lymphadenopathy:     Cervical: No cervical adenopathy.     Upper Body:     Right upper body: No supraclavicular, axillary or pectoral adenopathy.     Left upper body: No supraclavicular, axillary or pectoral adenopathy.  Skin:    General: Skin is warm and dry.     Findings: No bruising, erythema, lesion or rash.  Neurological:     Mental Status: She is alert and oriented to person, place, and time.  Psychiatric:        Mood and Affect: Mood normal.        Behavior: Behavior normal.        Thought Content: Thought content normal.        Judgment: Judgment normal.       Assessment and Plan:  Kim Little is a 25 y.o. female presenting to the [facility] Department for an initial well woman exam/family planning visit  Contraception counseling: Reviewed all forms of birth control options in the tiered based approach. available including abstinence; over the counter/barrier methods; hormonal contraceptive medication including pill, patch, ring, injection,contraceptive implant, ECP; hormonal and nonhormonal IUDs; permanent sterilization options including vasectomy and the various tubal sterilization modalities. Risks, benefits, and typical effectiveness rates were reviewed.  Questions were answered.  Written information was also given to the patient to review.  Patient desires to try the Xulane patch, this was prescribed for patient. She  will follow up in  1 year and prn  for surveillance.  She was told to call with any further questions, or with any concerns about this method of contraception.  Emphasized use of condoms 100% of the time for STI prevention.  Patient was not a candidate for ECP today.   1. Encounter for counseling regarding contraception Reviewed as above re: risks, benefits, and SE of BCMs. Enc to use barrier methods for STD protection.  2. Screening for STD (sexually transmitted disease) Await test results.  Counseled that RN will call if needs to RTC for treatment once results are back.  - WET PREP FOR TRICH, YEAST, CLUE - Chlamydia/Gonorrhea [facility] - HIV/HCV [facility] - Syphilis Serology, [place] [facility]  3. Well woman exam with routine gynecological exam Reviewed with patient healthy habits to maintain general health. Enc to continue with efforts to get to her goal weight and follow up with bariatric provider. Enc MVI  1 po daily. Enc to establish with/ follow up with PCP for primary care concerns, age appropriate screenings and illness. Await pap results.  Counseled that RN will send letter or call once results are back.  - IGP, rfx Aptima HPV ASCU  4. Encounter for initial prescription of transdermal patch hormonal contraceptive device Will send Rx for Xulane to patient's pharmacy of choice per her request. Counseled patient to start the patch at onset of next menses to prevent irregular bleeding. - norelgestromin-ethinyl estradiol [employer]ico) 150-35 MCG/24HR transdermal patch; Place 1 patch onto the skin once a week.  Dispense: 3 patch; Refill: 12  5. Bipolar affective disorder, remission status unspecified (HCC) Discussed with patient her history and that she is not currently having thoughts of self harm. List with [facility], and [employer] on it given to patient to establish care.  LCSW card also given to patient if she needs help with referral.  6. Major depressive disorder,  remission status unspecified, unspecified whether recurrent See above.   Return in about 1 year (around 10/18/2021) for RP and prn.  Future Appointments  Date Time Provider Department Center  10/20/2020  8:20 AM [facility] NURSE [facility] None    Matt Holmes, PA

## 2020-10-20 ENCOUNTER — Other Ambulatory Visit: Payer: Medicaid Other

## 2020-10-26 LAB — IGP, RFX APTIMA HPV ASCU: PAP Smear Comment: 0

## 2020-10-26 LAB — HPV APTIMA: HPV Aptima: POSITIVE — AB

## 2020-10-27 ENCOUNTER — Other Ambulatory Visit: Payer: Self-pay | Admitting: Family Medicine

## 2020-10-27 DIAGNOSIS — R87610 Atypical squamous cells of undetermined significance on cytologic smear of cervix (ASC-US): Secondary | ICD-10-CM

## 2020-10-27 DIAGNOSIS — R87810 Cervical high risk human papillomavirus (HPV) DNA test positive: Secondary | ICD-10-CM

## 2020-10-30 ENCOUNTER — Telehealth: Payer: Self-pay

## 2020-10-30 NOTE — Telephone Encounter (Signed)
Telephone call to patient regarding her PAP results and the need for a Colpo referral.  Left a message to return my call.  Hart Carwin, RN

## 2020-10-31 ENCOUNTER — Encounter: Payer: Self-pay | Admitting: Student

## 2020-10-31 ENCOUNTER — Telehealth: Payer: Self-pay

## 2020-10-31 DIAGNOSIS — Z205 Contact with and (suspected) exposure to viral hepatitis: Secondary | ICD-10-CM

## 2020-10-31 LAB — HM HEPATITIS C SCREENING LAB
HM Hepatitis Screen: POSITIVE
HM Hepatitis Screen: POSITIVE

## 2020-10-31 NOTE — Telephone Encounter (Signed)
TC with patient.  States she could see something on her mychart but wasn't sure if it was just the pap results.  Informed patient that there are no other concerns at this time besides her abnormal pap.  Patient states she has already spoken with nurse about pap referral Richmond Campbell, RN

## 2020-10-31 NOTE — Telephone Encounter (Signed)
Return call by patient yesterday.  Voicemail left to return her call.  Telephone call to patient this morning regarding her PAP results (ASCUS and + HPV).  Colpo referral needed per Lyndel Safe, MD.  Patient desires her referral to [facility].  [facility] referral completed today and message sent to [place].  Hart Carwin, RN

## 2020-12-01 ENCOUNTER — Other Ambulatory Visit: Payer: Self-pay

## 2020-12-01 ENCOUNTER — Ambulatory Visit (INDEPENDENT_AMBULATORY_CARE_PROVIDER_SITE_OTHER): Payer: BLUE CROSS/BLUE SHIELD | Admitting: Obstetrics and Gynecology

## 2020-12-01 ENCOUNTER — Other Ambulatory Visit (HOSPITAL_COMMUNITY)
Admission: RE | Admit: 2020-12-01 | Discharge: 2020-12-01 | Disposition: A | Discharge status: Home / Self Care | Payer: BLUE CROSS/BLUE SHIELD | Source: Ambulatory Visit | Attending: Obstetrics and Gynecology | Admitting: Obstetrics and Gynecology

## 2020-12-01 ENCOUNTER — Encounter: Payer: Self-pay | Admitting: Obstetrics and Gynecology

## 2020-12-01 VITALS — BP 120/70 | Ht 70.0 in | Wt 211.8 lb

## 2020-12-01 DIAGNOSIS — R87810 Cervical high risk human papillomavirus (HPV) DNA test positive: Secondary | ICD-10-CM | POA: Diagnosis present

## 2020-12-01 DIAGNOSIS — R87612 Low grade squamous intraepithelial lesion on cytologic smear of cervix (LGSIL): Secondary | ICD-10-CM | POA: Diagnosis not present

## 2020-12-01 DIAGNOSIS — Z30011 Encounter for initial prescription of contraceptive pills: Secondary | ICD-10-CM

## 2020-12-01 DIAGNOSIS — R87610 Atypical squamous cells of undetermined significance on cytologic smear of cervix (ASC-US): Secondary | ICD-10-CM | POA: Insufficient documentation

## 2020-12-01 MED ORDER — XULANE 150-35 MCG/24HR TD PTWK: 1.0000 | MEDICATED_PATCH | TRANSDERMAL | 12 refills | Status: AC

## 2020-12-01 NOTE — Progress Notes (Signed)
   GYNECOLOGY CLINIC COLPOSCOPY PROCEDURE NOTE  25 y.o. G0P0000 here for colposcopy for ASCUS with POSITIVE high risk HPV  pap smear on 10/19/2019. Discussed underlying role for HPV infection in the development of cervical dysplasia, its natural history and progression/regression, need for surveillance.  Is the patient  pregnant: No LMP: Patient's last menstrual period was 11/21/2020. Smoking status:  reports that she has never smoked. She has never used smokeless tobacco. Contraception: xulane Future fertility desired:  Yes  Patient given informed consent, signed copy in the chart, time out was performed.  The patient was position in dorsal lithotomy position. Speculum was placed the cervix was visualized.   After application of acetic acid colposcopic inspection of the cervix was undertaken.   Colposcopy adequate, full visualization of transformation zone: Yes acetowhite lesion(s) noted at 12 o'clock; corresponding biopsies obtained.   ECC specimen obtained:  Yes  All specimens were labeled and sent to pathology.   Patient was given post procedure instructions.  Will follow up pathology and manage accordingly.  Routine preventative health maintenance measures emphasized.  Physical Exam Genitourinary:     Exam conducted with a chaperone present.     Adelene Idler MD [facility], [facility] 12/01/2020 1:43 PM

## 2020-12-01 NOTE — Patient Instructions (Signed)

## 2020-12-05 LAB — SURGICAL PATHOLOGY

## 2020-12-12 NOTE — Progress Notes (Signed)
Patient was referred for Colpo through [facility]. Per Epic message:  Kim Little at [facility] has reached out to the patient and patient was at work and wants to call Kim Little back to schedule her Colpo.  Hart Carwin, RN

## 2021-02-08 ENCOUNTER — Encounter: Payer: Self-pay | Admitting: Nurse Practitioner

## 2021-02-08 NOTE — Progress Notes (Signed)
Patient had Colpo done on 12/01/2020.  After review of colposcopy results per Dr. Lyndel Safe patient will need another PAP in 1 year.  Will place patient on PAP tickler system. Glenna Fellows, RN

## 2021-12-26 ENCOUNTER — Telehealth: Payer: BC Managed Care – PPO | Admitting: Physician Assistant

## 2021-12-26 DIAGNOSIS — B379 Candidiasis, unspecified: Secondary | ICD-10-CM | POA: Diagnosis not present

## 2021-12-26 DIAGNOSIS — T3695XA Adverse effect of unspecified systemic antibiotic, initial encounter: Secondary | ICD-10-CM

## 2021-12-26 DIAGNOSIS — R3989 Other symptoms and signs involving the genitourinary system: Secondary | ICD-10-CM

## 2021-12-26 MED ORDER — FLUCONAZOLE 150 MG PO TABS
150.0000 mg | ORAL_TABLET | Freq: Once | ORAL | 0 refills | Status: AC
Start: 2021-12-26 — End: 2021-12-26

## 2021-12-26 MED ORDER — SULFAMETHOXAZOLE-TRIMETHOPRIM 800-160 MG PO TABS: 1.0000 | ORAL_TABLET | Freq: Two times a day (BID) | ORAL | 0 refills | Status: AC

## 2021-12-26 NOTE — Progress Notes (Signed)
?Virtual Visit Consent  ? ?Kim Little, you are scheduled for a virtual visit with a [facility] provider today.   ?  ?Just as with appointments in the office, your consent must be obtained to participate.  Your consent will be active for this visit and any virtual visit you may have with one of our providers in the next 365 days.   ?  ?If you have a MyChart account, a copy of this consent can be sent to you electronically.  All virtual visits are billed to your insurance company just like a traditional visit in the office.   ? ?As this is a virtual visit, video technology does not allow for your provider to perform a traditional examination.  This may limit your provider's ability to fully assess your condition.  If your provider identifies any concerns that need to be evaluated in person or the need to arrange testing (such as labs, EKG, etc.), we will make arrangements to do so.   ?  ?Although advances in technology are sophisticated, we cannot ensure that it will always work on either your end or our end.  If the connection with a video visit is poor, the visit may have to be switched to a telephone visit.  With either a video or telephone visit, we are not always able to ensure that we have a secure connection.    ? ?I need to obtain your verbal consent now.   Are you willing to proceed with your visit today?  ?  ?Kim Little has provided verbal consent on 12/26/2021 for a virtual visit (video or telephone). ?  ?Margaretann LovelessJennifer M Berthe Oley, PA-C  ? ?Date: 12/26/2021 2:29 PM ? ? ?Virtual Visit via Video Note  ? ?Delmer Islam, Mikaeel Petrow M Monica Codd, connected with  Kim Cokeubreiauna Schomburg  ([id number], 07/13/1996) on 12/26/21 at  2:15 PM EDT by a video-enabled telemedicine application and verified that I am speaking with the correct person using two identifiers. ? ?Location: ?Patient: Virtual Visit Location Patient: Home ?Provider: Virtual Visit Location Provider: Home Office ?  ?I discussed the limitations of evaluation and  management by telemedicine and the availability of in person appointments. The patient expressed understanding and agreed to proceed.   ? ?History of Present Illness: ?Kim Cokeubreiauna Hansson is a 26 y.o. who identifies as a female who was assigned female at birth, and is being seen today for possible UTI and/or yeast infection. ? ?HPI: Urinary Tract Infection  ?This is a new problem. The problem occurs every urination. The quality of the pain is described as aching and burning. There has been no fever. There is No history of pyelonephritis. Associated symptoms include frequency and urgency. Pertinent negatives include no chills, discharge, flank pain, hematuria, hesitancy, nausea, sweats or vomiting. Associated symptoms comments: Strong odor to urine, vaginal itching ?Marland Kitchen. She has tried nothing for the symptoms. The treatment provided no relief.   ? ? ?Problems:  ?Patient Active Problem List  ? Diagnosis Date Noted  ? Contact with and (suspected) exposure to viral hepatitis 10/31/2020  ? ASCUS with positive high risk HPV cervical 10/27/2020  ? ADHD (attention deficit hyperactivity disorder) 07/20/2012  ?  ?Allergies:  ?Allergies  ?Allergen Reactions  ? Lisdexamfetamine Other (See Comments)  ?  agitation  ? Morphine And Related Anxiety  ? ?Medications:  ?Current Outpatient Medications:  ?  fluconazole (DIFLUCAN) 150 MG tablet, Take 1 tablet (150 mg total) by mouth once for 1 dose. May repeat in 72 hours if needed, Disp: 2 tablet, Rfl:  0 ?  sulfamethoxazole-trimethoprim (BACTRIM DS) 800-160 MG tablet, Take 1 tablet by mouth 2 (two) times daily., Disp: 10 tablet, Rfl: 0 ?  albuterol (PROVENTIL HFA;VENTOLIN HFA) 108 (90 BASE) MCG/ACT inhaler, Inhale 2 puffs into the lungs every 6 (six) hours as needed for wheezing or shortness of breath., Disp: , Rfl:  ?  amoxicillin-clavulanate (AUGMENTIN) 875-125 MG tablet, Take 1 tablet by mouth every 12 (twelve) hours. (Patient not taking: Reported on 10/18/2020), Disp: 14 tablet, Rfl: 0 ?   Biotin 5 MG CAPS, Take 1 capsule by mouth 3 (three) times a week. Takes every other day, Disp: , Rfl:  ?  calcium citrate-vitamin D 500-500 MG-UNIT chewable tablet, Chew 1 tablet by mouth daily., Disp: , Rfl:  ?  Cyanocobalamin (B-12 PO), Take 1 tablet by mouth. Possibly 12000, takes every other day, Disp: , Rfl:  ?  FLUoxetine (PROZAC) 10 MG capsule, Take 10 mg by mouth daily. (Patient not taking: Reported on 10/18/2020), Disp: , Rfl: 0 ?  ibuprofen (ADVIL) 800 MG tablet, Take 1 tablet (800 mg total) by mouth 3 (three) times daily. (Patient not taking: Reported on 10/18/2020), Disp: 21 tablet, Rfl: 0 ?  Multiple Vitamins-Minerals (WOMENS MULTIVITAMIN PO), Take 1 tablet by mouth 1 day or 1 dose., Disp: , Rfl:  ?  norelgestromin-ethinyl estradiol [employer]ico) 150-35 MCG/24HR transdermal patch, Place 1 patch onto the skin once a week., Disp: 3 patch, Rfl: 12 ?  omeprazole (PRILOSEC) 40 MG capsule, Take 40 mg by mouth daily., Disp: , Rfl:  ?  ondansetron (ZOFRAN ODT) 4 MG disintegrating tablet, Take 1 tablet (4 mg total) by mouth every 8 (eight) hours as needed for nausea or vomiting. (Patient not taking: Reported on 10/18/2020), Disp: 20 tablet, Rfl: 0 ?  ranitidine (ZANTAC) 150 MG tablet, Take 1 tablet (150 mg total) by mouth 2 (two) times daily. (Patient not taking: Reported on 06/21/2015), Disp: 28 tablet, Rfl: 1 ?  traZODone (DESYREL) 50 MG tablet, Take 50 mg by mouth at bedtime. (Patient not taking: Reported on 10/18/2020), Disp: , Rfl: 0 ? ?Observations/Objective: ?Patient is well-developed, well-nourished in no acute distress.  ?Resting comfortably at home.  ?Head is normocephalic, atraumatic.  ?No labored breathing.  ?Speech is clear and coherent with logical content.  ?Patient is alert and oriented at baseline.  ? ? ?Assessment and Plan: ?1. Suspected UTI ?- sulfamethoxazole-trimethoprim (BACTRIM DS) 800-160 MG tablet; Take 1 tablet by mouth 2 (two) times daily.  Dispense: 10 tablet; Refill: 0 ? ?2. Antibiotic-induced  yeast infection ?- fluconazole (DIFLUCAN) 150 MG tablet; Take 1 tablet (150 mg total) by mouth once for 1 dose. May repeat in 72 hours if needed  Dispense: 2 tablet; Refill: 0 ? ?- Worsening symptoms.  ?- Will treat empirically with Bactrim  ?-- Diflucan given as prophylaxis as patient tends to get vaginal yeast infections with antibiotic use. ?- Continue to push fluids.  ?- She is to seek if symptoms do not improve or if they worsen.  ? ? ?Follow Up Instructions: ?I discussed the assessment and treatment plan with the patient. The patient was provided an opportunity to ask questions and all were answered. The patient agreed with the plan and demonstrated an understanding of the instructions.  A copy of instructions were sent to the patient via MyChart unless otherwise noted below.  ? ?The patient was advised to call back or seek an in-person evaluation if the symptoms worsen or if the condition fails to improve as anticipated. ? ?Time:  ?I spent  12 minutes with the patient via telehealth technology discussing the above problems/concerns.   ? ?Margaretann Loveless, PA-C ? ?

## 2021-12-26 NOTE — Patient Instructions (Signed)
?Kim Little, thank you for joining Margaretann Loveless, PA-C for today's virtual visit.  While this provider is not your primary care provider (PCP), if your PCP is located in our provider database this encounter information will be shared with them immediately following your visit. ? ?Consent: ?(Patient) Kim Little provided verbal consent for this virtual visit at the beginning of the encounter. ? ?Current Medications: ? ?Current Outpatient Medications:  ?  fluconazole (DIFLUCAN) 150 MG tablet, Take 1 tablet (150 mg total) by mouth once for 1 dose. May repeat in 72 hours if needed, Disp: 2 tablet, Rfl: 0 ?  sulfamethoxazole-trimethoprim (BACTRIM DS) 800-160 MG tablet, Take 1 tablet by mouth 2 (two) times daily., Disp: 10 tablet, Rfl: 0 ?  albuterol (PROVENTIL HFA;VENTOLIN HFA) 108 (90 BASE) MCG/ACT inhaler, Inhale 2 puffs into the lungs every 6 (six) hours as needed for wheezing or shortness of breath., Disp: , Rfl:  ?  amoxicillin-clavulanate (AUGMENTIN) 875-125 MG tablet, Take 1 tablet by mouth every 12 (twelve) hours. (Patient not taking: Reported on 10/18/2020), Disp: 14 tablet, Rfl: 0 ?  Biotin 5 MG CAPS, Take 1 capsule by mouth 3 (three) times a week. Takes every other day, Disp: , Rfl:  ?  calcium citrate-vitamin D 500-500 MG-UNIT chewable tablet, Chew 1 tablet by mouth daily., Disp: , Rfl:  ?  Cyanocobalamin (B-12 PO), Take 1 tablet by mouth. Possibly 12000, takes every other day, Disp: , Rfl:  ?  FLUoxetine (PROZAC) 10 MG capsule, Take 10 mg by mouth daily. (Patient not taking: Reported on 10/18/2020), Disp: , Rfl: 0 ?  ibuprofen (ADVIL) 800 MG tablet, Take 1 tablet (800 mg total) by mouth 3 (three) times daily. (Patient not taking: Reported on 10/18/2020), Disp: 21 tablet, Rfl: 0 ?  Multiple Vitamins-Minerals (WOMENS MULTIVITAMIN PO), Take 1 tablet by mouth 1 day or 1 dose., Disp: , Rfl:  ?  norelgestromin-ethinyl estradiol [employer]) 150-35 MCG/24HR transdermal patch, Place 1 patch onto the  skin once a week., Disp: 3 patch, Rfl: 12 ?  omeprazole (PRILOSEC) 40 MG capsule, Take 40 mg by mouth daily., Disp: , Rfl:  ?  ondansetron (ZOFRAN ODT) 4 MG disintegrating tablet, Take 1 tablet (4 mg total) by mouth every 8 (eight) hours as needed for nausea or vomiting. (Patient not taking: Reported on 10/18/2020), Disp: 20 tablet, Rfl: 0 ?  ranitidine (ZANTAC) 150 MG tablet, Take 1 tablet (150 mg total) by mouth 2 (two) times daily. (Patient not taking: Reported on 06/21/2015), Disp: 28 tablet, Rfl: 1 ?  traZODone (DESYREL) 50 MG tablet, Take 50 mg by mouth at bedtime. (Patient not taking: Reported on 10/18/2020), Disp: , Rfl: 0  ? ?Medications ordered in this encounter:  ?Meds ordered this encounter  ?Medications  ? sulfamethoxazole-trimethoprim (BACTRIM DS) 800-160 MG tablet  ?  Sig: Take 1 tablet by mouth 2 (two) times daily.  ?  Dispense:  10 tablet  ?  Refill:  0  ?  Order Specific Question:   Supervising Provider  ?  Answer:   Eber Hong [[id number]]  ? fluconazole (DIFLUCAN) 150 MG tablet  ?  Sig: Take 1 tablet (150 mg total) by mouth once for 1 dose. May repeat in 72 hours if needed  ?  Dispense:  2 tablet  ?  Refill:  0  ?  Order Specific Question:   Supervising Provider  ?  Answer:   Eber Hong [[id number]]  ?  ? ?*If you need refills on other medications prior to your next appointment,  please contact your pharmacy* ? ?Follow-Up: ?Call back or seek an in-person evaluation if the symptoms worsen or if the condition fails to improve as anticipated. ? ?Other Instructions ? ? ?Urinary Tract Infection, Adult ?A urinary tract infection (UTI) is an infection of any part of the urinary tract. The urinary tract includes the kidneys, ureters, bladder, and urethra. These organs make, store, and get rid of urine in the body. ?An upper UTI affects the ureters and kidneys. A lower UTI affects the bladder and urethra. ?What are the causes? ?Most urinary tract infections are caused by bacteria in your genital area around your  urethra, where urine leaves your body. These bacteria grow and cause inflammation of your urinary tract. ?What increases the risk? ?You are more likely to develop this condition if: ?You have a urinary catheter that stays in place. ?You are not able to control when you urinate or have a bowel movement (incontinence). ?You are female and you: ?Use a spermicide or diaphragm for birth control. ?Have low estrogen levels. ?Are pregnant. ?You have certain genes that increase your risk. ?You are sexually active. ?You take antibiotic medicines. ?You have a condition that causes your flow of urine to slow down, such as: ?An enlarged prostate, if you are female. ?Blockage in your urethra. ?A kidney stone. ?A nerve condition that affects your bladder control (neurogenic bladder). ?Not getting enough to drink, or not urinating often. ?You have certain medical conditions, such as: ?Diabetes. ?A weak disease-fighting system (immunesystem). ?Sickle cell disease. ?Gout. ?Spinal cord injury. ?What are the signs or symptoms? ?Symptoms of this condition include: ?Needing to urinate right away (urgency). ?Frequent urination. This may include small amounts of urine each time you urinate. ?Pain or burning with urination. ?Blood in the urine. ?Urine that smells bad or unusual. ?Trouble urinating. ?Cloudy urine. ?Vaginal discharge, if you are female. ?Pain in the abdomen or the lower back. ?You may also have: ?Vomiting or a decreased appetite. ?Confusion. ?Irritability or tiredness. ?A fever or chills. ?Diarrhea. ?The first symptom in older adults may be confusion. In some cases, they may not have any symptoms until the infection has worsened. ?How is this diagnosed? ?This condition is diagnosed based on your medical history and a physical exam. You may also have other tests, including: ?Urine tests. ?Blood tests. ?Tests for STIs (sexually transmitted infections). ?If you have had more than one UTI, a cystoscopy or imaging studies may be  done to determine the cause of the infections. ?How is this treated? ?Treatment for this condition includes: ?Antibiotic medicine. ?Over-the-counter medicines to treat discomfort. ?Drinking enough water to stay hydrated. ?If you have frequent infections or have other conditions such as a kidney stone, you may need to see a health care provider who specializes in the urinary tract (urologist). ?In rare cases, urinary tract infections can cause sepsis. Sepsis is a life-threatening condition that occurs when the body responds to an infection. Sepsis is treated in the hospital with IV antibiotics, fluids, and other medicines. ?Follow these instructions at home: ?Medicines ?Take over-the-counter and prescription medicines only as told by your health care provider. ?If you were prescribed an antibiotic medicine, take it as told by your health care provider. Do not stop using the antibiotic even if you start to feel better. ?General instructions ?Make sure you: ?Empty your bladder often and completely. Do not hold urine for long periods of time. ?Empty your bladder after sex. ?Wipe from front to back after urinating or having a bowel movement if you  are female. Use each tissue only one time when you wipe. ?Drink enough fluid to keep your urine pale yellow. ?Keep all follow-up visits. This is important. ?Contact a health care provider if: ?Your symptoms do not get better after 1-2 days. ?Your symptoms go away and then return. ?Get help right away if: ?You have severe pain in your back or your lower abdomen. ?You have a fever or chills. ?You have nausea or vomiting. ?Summary ?A urinary tract infection (UTI) is an infection of any part of the urinary tract, which includes the kidneys, ureters, bladder, and urethra. ?Most urinary tract infections are caused by bacteria in your genital area. ?Treatment for this condition often includes antibiotic medicines. ?If you were prescribed an antibiotic medicine, take it as told by your  health care provider. Do not stop using the antibiotic even if you start to feel better. ?Keep all follow-up visits. This is important. ?This information is not intended to replace advice given to you by you

## 2022-01-18 NOTE — Progress Notes (Signed)
PAP letter mailed today.  Repeat Pap and PE due 11-2021. Hart Carwin, RN ? ?

## 2022-02-14 ENCOUNTER — Telehealth: Payer: Self-pay

## 2022-02-14 NOTE — Telephone Encounter (Signed)
Telephone call to patient today regarding her repeat PAP and PE due April 2023. Voicemail full. Hart Carwin, RN

## 2022-02-19 NOTE — Telephone Encounter (Signed)
Telephone call to patient this afternoon regarding the need for a repeat PAP and PE due 12-2021.  Voicemail is full.  MyChart message sent to patient today. Hart Carwin, RN

## 2022-02-21 NOTE — Telephone Encounter (Signed)
Telephone call to patient today regarding the need for a repeat PAP and PE due 12-2021.  Left a message for patient to call [phone] to schedule her appointment.  Hart Carwin, RN

## 2022-02-22 NOTE — Progress Notes (Signed)
Close to PAP f/u until patient initiates contact with [facility].  No response to PAP letter mailed 01-18-2022, nor MyChart message sent on 02-19-2022 or message left 02-21-2022. Dahlia Bailiff, RN

## 2024-09-11 ENCOUNTER — Emergency Department (HOSPITAL_COMMUNITY)
Admission: EM | Admit: 2024-09-11 | Discharge: 2024-09-11 | Disposition: A | Discharge status: Home / Self Care | Attending: Emergency Medicine | Admitting: Emergency Medicine

## 2024-09-11 ENCOUNTER — Emergency Department (HOSPITAL_COMMUNITY)

## 2024-09-11 ENCOUNTER — Other Ambulatory Visit: Payer: Self-pay

## 2024-09-11 DIAGNOSIS — F419 Anxiety disorder, unspecified: Secondary | ICD-10-CM | POA: Insufficient documentation

## 2024-09-11 DIAGNOSIS — R002 Palpitations: Secondary | ICD-10-CM | POA: Insufficient documentation

## 2024-09-11 DIAGNOSIS — Z79899 Other long term (current) drug therapy: Secondary | ICD-10-CM | POA: Insufficient documentation

## 2024-09-11 LAB — CBC WITH DIFFERENTIAL/PLATELET
Abs Immature Granulocytes: 0.02 K/uL (ref 0.00–0.07)
Basophils Absolute: 0 K/uL (ref 0.0–0.1)
Basophils Relative: 0 %
Eosinophils Absolute: 0.1 K/uL (ref 0.0–0.5)
Eosinophils Relative: 2 %
HCT: 40.8 % (ref 36.0–46.0)
Hemoglobin: 13.5 g/dL (ref 12.0–15.0)
Immature Granulocytes: 0 %
Lymphocytes Relative: 24 %
Lymphs Abs: 1.8 K/uL (ref 0.7–4.0)
MCH: 31.7 pg (ref 26.0–34.0)
MCHC: 33.1 g/dL (ref 30.0–36.0)
MCV: 95.8 fL (ref 80.0–100.0)
Monocytes Absolute: 0.7 K/uL (ref 0.1–1.0)
Monocytes Relative: 10 %
Neutro Abs: 4.9 K/uL (ref 1.7–7.7)
Neutrophils Relative %: 64 %
Platelets: 322 K/uL (ref 150–400)
RBC: 4.26 MIL/uL (ref 3.87–5.11)
RDW: 12.6 % (ref 11.5–15.5)
WBC: 7.5 K/uL (ref 4.0–10.5)
nRBC: 0 % (ref 0.0–0.2)

## 2024-09-11 LAB — BASIC METABOLIC PANEL WITH GFR
Anion gap: 12 (ref 5–15)
BUN: 7 mg/dL (ref 6–20)
CO2: 23 mmol/L (ref 22–32)
Calcium: 9.3 mg/dL (ref 8.9–10.3)
Chloride: 104 mmol/L (ref 98–111)
Creatinine, Ser: 0.81 mg/dL (ref 0.44–1.00)
GFR, Estimated: >60 mL/min (ref >60)
Glucose, Bld: 74 mg/dL (ref 70–99)
Potassium: 4 mmol/L (ref 3.5–5.1)
Sodium: 139 mmol/L (ref 135–145)

## 2024-09-11 LAB — HCG, SERUM, QUALITATIVE: Preg, Serum: NEGATIVE

## 2024-09-11 LAB — TROPONIN T, HIGH SENSITIVITY: Troponin T High Sensitivity: <15 ng/L (ref 0–19)

## 2024-09-11 MED ORDER — HYDROXYZINE HCL 25 MG PO TABS
50.0000 mg | ORAL_TABLET | Freq: Once | ORAL | Status: AC
  Administered 2024-09-11: 50 mg via ORAL
  Filled 2024-09-11: qty 2

## 2024-09-11 NOTE — ED Provider Notes (Signed)
 [facility] AT [facility] Provider Note   CSN: [id number] Arrival date & time: 09/11/24  1030     Patient presents with: Anxiety   Kim Little is a 28 y.o. female with past medical history of anxiety, ADHD who presents emergency department for evaluation of anxiety.  Patient reports she was drinking with her friends last night when she began experiencing palpitations.  Patient reports that her heart was racing last night.  She denies any drug use.  When patient woke up this morning she continued to report anxiety.  Patient states she did take her anxiety medicines this morning as prescribed.    Anxiety       Prior to Admission medications  Medication Sig Start Date End Date Taking? Authorizing Provider  albuterol (PROVENTIL HFA;VENTOLIN HFA) 108 (90 BASE) MCG/ACT inhaler Inhale 2 puffs into the lungs every 6 (six) hours as needed for wheezing or shortness of breath.    [provider]  amoxicillin -clavulanate (AUGMENTIN ) 875-125 MG tablet Take 1 tablet by mouth every 12 (twelve) hours. Patient not taking: Reported on 10/18/2020 08/07/20   Adah Corning A, FNP  Biotin 5 MG CAPS Take 1 capsule by mouth 3 (three) times a week. Takes every other day    [provider]  calcium citrate-vitamin D 500-500 MG-UNIT chewable tablet Chew 1 tablet by mouth daily.    [provider]  Cyanocobalamin (B-12 PO) Take 1 tablet by mouth. Possibly 12000, takes every other day    [provider]  FLUoxetine (PROZAC) 10 MG capsule Take 10 mg by mouth daily. Patient not taking: Reported on 10/18/2020 08/11/17   [provider]  ibuprofen  (ADVIL ) 800 MG tablet Take 1 tablet (800 mg total) by mouth 3 (three) times daily. Patient not taking: Reported on 10/18/2020 08/07/20   Adah Corning LABOR, FNP  Multiple Vitamins-Minerals (WOMENS MULTIVITAMIN PO) Take 1 tablet by mouth 1 day or 1 dose.    [provider]   norelgestromin-ethinyl estradiol (XULANE ) 150-35 MCG/24HR transdermal patch Place 1 patch onto the skin once a week. 12/01/20   Schuman, Christanna R, MD  omeprazole (PRILOSEC) 40 MG capsule Take 40 mg by mouth daily.    [provider]  ondansetron  (ZOFRAN  ODT) 4 MG disintegrating tablet Take 1 tablet (4 mg total) by mouth every 8 (eight) hours as needed for nausea or vomiting. Patient not taking: Reported on 10/18/2020 08/21/17   Judd Betters, MD  ranitidine  (ZANTAC ) 150 MG tablet Take 1 tablet (150 mg total) by mouth 2 (two) times daily. Patient not taking: Reported on 06/21/2015 02/09/15 02/09/16  Audie Alm ORN, MD  sulfamethoxazole -trimethoprim  (BACTRIM  DS) 800-160 MG tablet Take 1 tablet by mouth 2 (two) times daily. 12/26/21   Vivienne Delon HERO, PA-C  traZODone (DESYREL) 50 MG tablet Take 50 mg by mouth at bedtime. Patient not taking: Reported on 10/18/2020 07/28/17   [provider]    Allergies: Lisdexamfetamine, Quetiapine, Amoxicillin , Morphine , and Morphine  and codeine    Review of Systems  Psychiatric/Behavioral:  The patient is nervous/anxious.     Updated Vital Signs BP (!) 138/97   Pulse 87   Temp 98.1 F (36.7 C)   Resp 18   LMP 09/04/2024 (Approximate)   SpO2 100%   Physical Exam Vitals and nursing note reviewed.  Constitutional:      Appearance: Normal appearance.  HENT:     Head: Normocephalic and atraumatic.     Mouth/Throat:     Mouth: Mucous membranes are  moist.  Eyes:     General: No scleral icterus.       Right eye: No discharge.        Left eye: No discharge.     Conjunctiva/sclera: Conjunctivae normal.  Cardiovascular:     Rate and Rhythm: Normal rate and regular rhythm.     Pulses: Normal pulses.  Pulmonary:     Effort: Pulmonary effort is normal.     Breath sounds: Normal breath sounds.  Abdominal:     General: There is no distension.     Tenderness: There is no abdominal tenderness.  Musculoskeletal:        General:  No deformity.     Cervical back: Normal range of motion.  Skin:    General: Skin is warm and dry.     Capillary Refill: Capillary refill takes less than 2 seconds.  Neurological:     Mental Status: She is alert.     Motor: No weakness.  Psychiatric:        Mood and Affect: Mood normal.     (all labs ordered are listed, but only abnormal results are displayed) Labs Reviewed  CBC WITH DIFFERENTIAL/PLATELET  BASIC METABOLIC PANEL WITH GFR  HCG, SERUM, QUALITATIVE  TROPONIN T, HIGH SENSITIVITY  TROPONIN T, HIGH SENSITIVITY    EKG: EKG Interpretation Date/Time:  Saturday September 11 2024 11:40:31 EST Ventricular Rate:  63 PR Interval:  174 QRS Duration:  83 QT Interval:  392 QTC Calculation: 402 R Axis:   90  Text Interpretation: Sinus or ectopic atrial rhythm Borderline right axis deviation Similar to prior Confirmed by Franklyn Gills [phone]) on 09/11/2024 11:43:58 AM  Radiology: [facility] Chest 2 View Result Date: 09/11/2024 CLINICAL DATA:  chest pain EXAM: CHEST - 2 VIEW COMPARISON:  October 16, 2020 FINDINGS: The cardiomediastinal silhouette is normal in contour. No pleural effusion. No pneumothorax. No acute pleuroparenchymal abnormality. Visualized abdomen is unremarkable. No acute osseous abnormality noted. IMPRESSION: No acute cardiopulmonary abnormality. Electronically Signed   By: Corean Salter M.D.   On: 09/11/2024 12:01    Procedures   Medications Ordered in the ED  hydrOXYzine  (ATARAX ) tablet 50 mg (has no administration in time range)                                Medical Decision Making Amount and/or Complexity of Data Reviewed Labs: ordered. Radiology: ordered.   This patient presents to the ED for concern of anxiety, this involves an extensive number of treatment options, and is a complaint that carries with it a high risk of complications and morbidity.   Differential diagnosis includes: Anxiety attack, panic disorder, hyperthyroid, ACS, PE, withdrawal,  hypoglycemia  Co morbidities:  anxiety, ADHD   Lab Tests:  I Ordered, and personally interpreted labs.  The pertinent results include: No acute abnormalities to explain patient symptoms  Imaging Studies:  I ordered imaging studies including chest x-ray I independently visualized and interpreted imaging which showed no acute cardiopulmonary abnormality I agree with the radiologist interpretation  Cardiac Monitoring/ECG:  The patient was maintained on a cardiac monitor.  I personally viewed and interpreted the cardiac monitored which showed an underlying rhythm of: Sinus rhythm  Medicines ordered and prescription drug management:  I ordered medication including  Medications  hydrOXYzine  (ATARAX ) tablet 50 mg (has no administration in time range)   for anxiety Reevaluation of the patient after these medicines showed that the patient improved I have  reviewed the patients home medicines and have made adjustments as needed  Test Considered:   none  Critical Interventions:   none  Consultations Obtained: None  Problem List / ED Course:     ICD-10-CM   1. Anxiety  F41.9       MDM: 28 year old female who presents emergency department for evaluation of anxiety.  Patient did take her anxiety medicines as prescribed this morning.  Her workup was negative.  Troponin is not elevated.  EKG shows sinus rhythm.  Patient is PERC negative so no indication for further evaluation of PE.  Chest x-ray shows no acute cardiopulmonary abnormality.  Patient's blood sugar is stable at 74.  I did give the patient a dose of hydroxyzine  for symptoms of anxiety.  I recommend patient follow-up with her PCP for further evaluation.  Vital signs are stable.  Patient is appropriate for discharge at this time.   Dispostion:  After consideration of the diagnostic results and the patients response to treatment, I feel that the patient would benefit from supportive care and PCP follow-up.   Final  diagnoses:  Anxiety    ED Discharge Orders     None          Torrence Marry RAMAN, PA-C 09/11/24 1250    Franklyn Sid SAILOR, MD 09/11/24 [phone]

## 2024-09-11 NOTE — ED Triage Notes (Signed)
 Pt has c/o anxiety and uncontrollable eye blinking. Pt states she drank some alcohol with friends last night and then this began. Pt denies any drug use, and states that she takes Valium for anxiety- but it is not helping today.

## 2024-09-11 NOTE — Discharge Instructions (Addendum)
 It was a pleasure taking care of you today. You were seen in the Emergency Department for evaluation of anxiety. Your work-up was reassuring. Your x-ray and lab work showed no acute abnormality to explain your symptoms.  I recommend following up with your PCP for further evaluations if your symptoms continue for a possible medication adjustment.  I did give you a dose of hydroxyzine , which does help with symptoms of anxiety. Refer to the attached documentation for further management of your symptoms. Follow up with your PCP if your symptoms continue.  Please return to the ER if you experience chest pain, trouble breathing, intractable nausea/vomiting or any other life threatening illnesses.

## 2024-09-11 NOTE — ED Provider Triage Note (Signed)
 Emergency Medicine Provider Triage Evaluation Note  Kim Little , a 28 y.o. female  was evaluated in triage.  Pt complains of anxiety and excessive blinking.  Patient reports she was at home with some friends having some alcoholic beverages last night when she became extremely anxious.  She reports a rapid heart rate, chest heaviness and tightness.  Does have a history of anxiety at baseline and took all of her medications as prescribed this morning.  Review of Systems  Positive: Anxiety, palpitations, chest pain Negative: Nausea, vomiting, shortness of breath  Physical Exam  BP (!) 138/97  Gen:   Awake, mildly tearful Resp:  Normal effort  MSK:   Moves extremities without difficulty    Medical Decision Making  Medically screening exam initiated at 11:19 AM.  Appropriate orders placed.  Kenneth Lax was informed that the remainder of the evaluation will be completed by another provider, this initial triage assessment does not replace that evaluation, and the importance of remaining in the ED until their evaluation is complete.  Basic labs, EKG, chest x-ray ordered   Torrence Marry RAMAN, PA-C 09/11/24 1120

## 2024-09-22 ENCOUNTER — Encounter

## 2024-10-06 ENCOUNTER — Encounter: Payer: Self-pay | Admitting: Emergency Medicine

## 2024-10-06 DIAGNOSIS — R519 Headache, unspecified: Secondary | ICD-10-CM | POA: Insufficient documentation

## 2024-10-06 DIAGNOSIS — I10 Essential (primary) hypertension: Secondary | ICD-10-CM | POA: Diagnosis not present

## 2024-10-06 DIAGNOSIS — G2571 Drug induced akathisia: Secondary | ICD-10-CM | POA: Insufficient documentation

## 2024-10-06 DIAGNOSIS — J45909 Unspecified asthma, uncomplicated: Secondary | ICD-10-CM | POA: Diagnosis not present

## 2024-10-06 NOTE — ED Triage Notes (Signed)
 Pt presents to the ED via EMS with complaints of headache - located in the back of her head. She notes taking 2 excedrin ~2 hours ago when the pain started. Rates the pain is 10/10 - states the pain is sharp/throbbing. A&Ox4 at this time. Denies vision changes, dizziness, CP or SOB.

## 2024-10-07 ENCOUNTER — Emergency Department
Admission: EM | Admit: 2024-10-07 | Discharge: 2024-10-07 | Disposition: A | Discharge status: Home / Self Care | Attending: Emergency Medicine | Admitting: Emergency Medicine

## 2024-10-07 ENCOUNTER — Emergency Department

## 2024-10-07 DIAGNOSIS — R519 Headache, unspecified: Secondary | ICD-10-CM

## 2024-10-07 DIAGNOSIS — G2571 Drug induced akathisia: Secondary | ICD-10-CM

## 2024-10-07 LAB — BASIC METABOLIC PANEL WITH GFR
Anion gap: 7 (ref 5–15)
BUN: 10 mg/dL (ref 6–20)
CO2: 27 mmol/L (ref 22–32)
Calcium: 8.6 mg/dL — ABNORMAL LOW (ref 8.9–10.3)
Chloride: 105 mmol/L (ref 98–111)
Creatinine, Ser: 0.69 mg/dL (ref 0.44–1.00)
GFR, Estimated: >60 mL/min
Glucose, Bld: 83 mg/dL (ref 70–99)
Potassium: 4.8 mmol/L (ref 3.5–5.1)
Sodium: 139 mmol/L (ref 135–145)

## 2024-10-07 LAB — CBC WITH DIFFERENTIAL/PLATELET
Abs Immature Granulocytes: 0.02 K/uL (ref 0.00–0.07)
Basophils Absolute: 0 K/uL (ref 0.0–0.1)
Basophils Relative: 0 %
Eosinophils Absolute: 0.1 K/uL (ref 0.0–0.5)
Eosinophils Relative: 1 %
HCT: 36.3 % (ref 36.0–46.0)
Hemoglobin: 12.4 g/dL (ref 12.0–15.0)
Immature Granulocytes: 0 %
Lymphocytes Relative: 38 %
Lymphs Abs: 2.8 K/uL (ref 0.7–4.0)
MCH: 31.8 pg (ref 26.0–34.0)
MCHC: 34.2 g/dL (ref 30.0–36.0)
MCV: 93.1 fL (ref 80.0–100.0)
Monocytes Absolute: 0.7 K/uL (ref 0.1–1.0)
Monocytes Relative: 10 %
Neutro Abs: 3.6 K/uL (ref 1.7–7.7)
Neutrophils Relative %: 51 %
Platelets: 291 K/uL (ref 150–400)
RBC: 3.9 MIL/uL (ref 3.87–5.11)
RDW: 12.3 % (ref 11.5–15.5)
WBC: 7.3 K/uL (ref 4.0–10.5)
nRBC: 0 % (ref 0.0–0.2)

## 2024-10-07 LAB — HCG, QUANTITATIVE, PREGNANCY: hCG, Beta Chain, Quant, S: <1 m[IU]/mL

## 2024-10-07 MED ORDER — DIPHENHYDRAMINE HCL 50 MG/ML IJ SOLN
25.0000 mg | Freq: Once | INTRAMUSCULAR | Status: AC
  Administered 2024-10-07: 25 mg via INTRAVENOUS
  Filled 2024-10-07: qty 1

## 2024-10-07 MED ORDER — DIPHENHYDRAMINE HCL 50 MG/ML IJ SOLN: 25.0000 mg | Freq: Once | INTRAMUSCULAR | Status: AC

## 2024-10-07 MED ORDER — LORAZEPAM 2 MG/ML IJ SOLN: 0.5000 mg | Freq: Once | INTRAMUSCULAR | Status: AC

## 2024-10-07 MED ORDER — DIPHENHYDRAMINE HCL 50 MG/ML IJ SOLN
INTRAMUSCULAR | Status: AC
  Administered 2024-10-07: 25 mg via INTRAVENOUS
  Filled 2024-10-07: qty 1

## 2024-10-07 MED ORDER — KETOROLAC TROMETHAMINE 30 MG/ML IJ SOLN
30.0000 mg | Freq: Once | INTRAMUSCULAR | Status: AC
  Administered 2024-10-07: 30 mg via INTRAVENOUS
  Filled 2024-10-07: qty 1

## 2024-10-07 MED ORDER — LORAZEPAM 2 MG/ML IJ SOLN
0.5000 mg | Freq: Once | INTRAMUSCULAR | Status: AC
  Administered 2024-10-07: 0.5 mg via INTRAVENOUS

## 2024-10-07 MED ORDER — LORAZEPAM 2 MG/ML IJ SOLN
1.0000 mg | Freq: Once | INTRAMUSCULAR | Status: AC
  Administered 2024-10-07: 1 mg via INTRAVENOUS
  Filled 2024-10-07: qty 1

## 2024-10-07 MED ORDER — LORAZEPAM 2 MG/ML IJ SOLN
INTRAMUSCULAR | Status: AC
  Administered 2024-10-07: 0.5 mg via INTRAVENOUS
  Filled 2024-10-07: qty 1

## 2024-10-07 MED ORDER — ROPINIROLE HCL 1 MG PO TABS
1.0000 mg | ORAL_TABLET | Freq: Once | ORAL | Status: AC
  Administered 2024-10-07: 1 mg via ORAL
  Filled 2024-10-07: qty 1

## 2024-10-07 MED ORDER — IOHEXOL 350 MG/ML SOLN
75.0000 mL | Freq: Once | INTRAVENOUS | Status: AC | PRN
  Administered 2024-10-07: 75 mL via INTRAVENOUS

## 2024-10-07 MED ORDER — PROCHLORPERAZINE EDISYLATE 10 MG/2ML IJ SOLN
10.0000 mg | Freq: Once | INTRAMUSCULAR | Status: AC
  Administered 2024-10-07: 10 mg via INTRAVENOUS
  Filled 2024-10-07: qty 2

## 2024-10-07 MED ORDER — SODIUM CHLORIDE 0.9 % IV BOLUS (SEPSIS)
1000.0000 mL | Freq: Once | INTRAVENOUS | Status: AC
  Administered 2024-10-07: 1000 mL via INTRAVENOUS

## 2024-10-07 MED ORDER — SODIUM CHLORIDE 0.9 % IV SOLN: INTRAVENOUS | Status: DC

## 2024-10-07 NOTE — ED Notes (Signed)
 Pt reported pain to LAC IV. Flushed with saline. Some swelling noted. IV removed. IV replaced to 22G R hand.

## 2024-10-07 NOTE — ED Notes (Signed)
 Patient verbalizes understanding of discharge instructions. Opportunity for questioning and answers were provided. Armband removed by staff, pt discharged from ED. Pt's ride has arrived, wheeled out to car with mother to drive home.

## 2024-10-07 NOTE — ED Notes (Signed)
 ED Provider at bedside.

## 2024-10-07 NOTE — ED Notes (Signed)
 Pt returned to room 46 - unable to complete angio study d/t pt continued involuntary spasms. EDP ward updated.

## 2024-10-07 NOTE — ED Notes (Signed)
 Pt has not responded to previously given medications - see mar. Spams continue. Pt given an additional 0.5 mg for a total of 1 mg ativan .

## 2024-10-07 NOTE — ED Notes (Signed)
 Requested requip  from main pharmacy

## 2024-10-07 NOTE — ED Notes (Signed)
 Pt having uncontrolled spams in CT following IV compazine . Given IV ativan  and benadryl  per MD order at CT scanner.

## 2024-10-07 NOTE — Discharge Instructions (Addendum)
 You may alternate over the counter Tylenol 1000 mg every 6 hours as needed for pain, fever and Ibuprofen 800 mg every 6-8 hours as needed for pain, fever.  Please take Ibuprofen with food.  Do not take more than 4000 mg of Tylenol (acetaminophen) in a 24 hour period.

## 2024-10-07 NOTE — ED Provider Notes (Addendum)
 "  [facility] Provider Note    Event Date/Time   First MD Initiated Contact with Patient 10/07/24 0007     (approximate)   History   Headache   HPI  Kim Little is a 29 y.o. female with history of bipolar disorder, ADHD, anxiety, depression, asthma, migraines who presents to the emergency department with severe headache that she describes as starting in the back of her head about 2 hours prior to arrival while at rest.  She states it came on suddenly and has progressively worsened.  Feels different than chronic headaches that she had years ago.  She states that she also had pain in her neck and felt the back of her head was numb.  Denies any head injury, neck injury.  No fever.  Not on blood thinners.  Denies any other numbness or weakness.  No photophobia or phonophobia.  No nausea or vomiting.  She is on birth control and does smoke.   History provided by patient.    Past Medical History:  Diagnosis Date   ADHD    Anxiety    Asthma    Bipolar affective (HCC)    Hypertension    10/18/20 - pt states resolved since getting duodenal switch procedure   Major depression     Past Surgical History:  Procedure Laterality Date   ADENOIDECTOMY     GASTROPLASTY DUODENAL SWITCH  10/22/2019   TONSILLECTOMY      MEDICATIONS:  Prior to Admission medications  Medication Sig Start Date End Date Taking? Authorizing Provider  albuterol (PROVENTIL HFA;VENTOLIN HFA) 108 (90 BASE) MCG/ACT inhaler Inhale 2 puffs into the lungs every 6 (six) hours as needed for wheezing or shortness of breath.    [provider]  amoxicillin -clavulanate (AUGMENTIN ) 875-125 MG tablet Take 1 tablet by mouth every 12 (twelve) hours. Patient not taking: Reported on 10/18/2020 08/07/20   Adah Corning A, FNP  Biotin 5 MG CAPS Take 1 capsule by mouth 3 (three) times a week. Takes every other day    [provider]  calcium citrate-vitamin D 500-500 MG-UNIT chewable tablet  Chew 1 tablet by mouth daily.    [provider]  Cyanocobalamin (B-12 PO) Take 1 tablet by mouth. Possibly 12000, takes every other day    [provider]  FLUoxetine (PROZAC) 10 MG capsule Take 10 mg by mouth daily. Patient not taking: Reported on 10/18/2020 08/11/17   [provider]  ibuprofen  (ADVIL ) 800 MG tablet Take 1 tablet (800 mg total) by mouth 3 (three) times daily. Patient not taking: Reported on 10/18/2020 08/07/20   Adah Corning LABOR, FNP  Multiple Vitamins-Minerals (WOMENS MULTIVITAMIN PO) Take 1 tablet by mouth 1 day or 1 dose.    [provider]  norelgestromin-ethinyl estradiol (XULANE ) 150-35 MCG/24HR transdermal patch Place 1 patch onto the skin once a week. 12/01/20   Schuman, Christanna R, MD  omeprazole (PRILOSEC) 40 MG capsule Take 40 mg by mouth daily.    [provider]  ondansetron  (ZOFRAN  ODT) 4 MG disintegrating tablet Take 1 tablet (4 mg total) by mouth every 8 (eight) hours as needed for nausea or vomiting. Patient not taking: Reported on 10/18/2020 08/21/17   Judd Betters, MD  ranitidine  (ZANTAC ) 150 MG tablet Take 1 tablet (150 mg total) by mouth 2 (two) times daily. Patient not taking: Reported on 06/21/2015 02/09/15 02/09/16  Audie Alm ORN, MD  sulfamethoxazole -trimethoprim  (BACTRIM  DS) 800-160 MG tablet Take 1 tablet by mouth 2 (two) times daily.  12/26/21   Vivienne Delon HERO, PA-C  traZODone (DESYREL) 50 MG tablet Take 50 mg by mouth at bedtime. Patient not taking: Reported on 10/18/2020 07/28/17   [provider]    Physical Exam   Triage Vital Signs: ED Triage Vitals  Encounter Vitals Group     BP 10/06/24 2334 116/65     Girls Systolic BP Percentile --      Girls Diastolic BP Percentile --      Boys Systolic BP Percentile --      Boys Diastolic BP Percentile --      Pulse Rate 10/06/24 2334 70     Resp 10/06/24 2334 17     Temp 10/06/24 2334 98.2 F (36.8 C)     Temp Source 10/06/24 2334 Oral      SpO2 10/06/24 2334 99 %     Weight 10/06/24 2334 225 lb (102.1 kg)     Height 10/06/24 2334 5' 10 (1.778 m)     Head Circumference --      Peak Flow --      Pain Score 10/06/24 2336 10     Pain Loc --      Pain Education --      Exclude from Growth Chart --     Most recent vital signs: Vitals:   10/07/24 0415 10/07/24 0430  BP:    Pulse: 74 70  Resp:    Temp:    SpO2:      CONSTITUTIONAL: Alert, responds appropriately to questions.  Appears uncomfortable but not in distress HEAD: Normocephalic, atraumatic EYES: Conjunctivae clear, pupils appear equal, sclera nonicteric ENT: normal nose; moist mucous membranes NECK: Supple, normal ROM, no meningismus, no midline spinal tenderness or step-off or deformity CARD: RRR; S1 and S2 appreciated RESP: Normal chest excursion without splinting or tachypnea; breath sounds clear and equal bilaterally; no wheezes, no rhonchi, no rales, no hypoxia or respiratory distress, speaking full sentences ABD/GI: Non-distended; soft, non-tender, no rebound, no guarding, no peritoneal signs BACK: The back appears normal EXT: Normal ROM in all joints; no deformity noted, no edema SKIN: Normal color for age and race; warm; no rash on exposed skin NEURO: Moves all extremities equally, normal speech, normal sensation diffusely, no facial asymmetry PSYCH: The patient's mood and manner are appropriate.   ED Results / Procedures / Treatments   LABS: (all labs ordered are listed, but only abnormal results are displayed) Labs Reviewed  BASIC METABOLIC PANEL WITH GFR - Abnormal; Notable for the following components:      Result Value   Calcium 8.6 (*)    All other components within normal limits  CBC WITH DIFFERENTIAL/PLATELET  HCG, QUANTITATIVE, PREGNANCY     EKG:  EKG Interpretation Date/Time:    Ventricular Rate:    PR Interval:    QRS Duration:    QT Interval:    QTC Calculation:   R Axis:      Text Interpretation:            RADIOLOGY: My personal review and interpretation of imaging: CT head unremarkable.  I have personally reviewed all radiology reports.   CT HEAD WO CONTRAST ( ) Result Date: 10/07/2024 EXAM: CT HEAD WITHOUT CONTRAST 10/07/2024 02:19:21 AM TECHNIQUE: CT of the head was performed without the administration of intravenous contrast. Automated exposure control, iterative reconstruction, and/or weight based adjustment of the mA/kV was utilized to reduce the radiation dose to as low as reasonably achievable. COMPARISON: 04/03/2015 CLINICAL HISTORY: severe sudden onset HA at 1045  pm FINDINGS: BRAIN AND VENTRICLES: No acute hemorrhage. No evidence of acute infarct. No hydrocephalus. No extra-axial collection. No mass effect or midline shift. ORBITS: No acute abnormality. SINUSES: No acute abnormality. SOFT TISSUES AND SKULL: No acute soft tissue abnormality. No skull fracture. IMPRESSION: 1. No acute intracranial abnormality. Electronically signed by: Franky Crease MD MD 10/07/2024 02:35 AM EST RP Workstation: [id number]     PROCEDURES:  Critical Care performed: No     Procedures    IMPRESSION / MDM / ASSESSMENT AND PLAN / ED COURSE  I reviewed the triage vital signs and the nursing notes.    Patient here with severe sudden onset headache.  Has history of migraines years ago but states this feels different to her.  Denies fevers, no neck pain or neck stiffness, no focal neurodeficits.  The patient is on the cardiac monitor to evaluate for evidence of arrhythmia and/or significant heart rate changes.   DIFFERENTIAL DIAGNOSIS (includes but not limited to):   Migraine, tension headache, cavernous sinus thrombosis, intracranial hemorrhage, ruptured aneurysm, doubt meningitis, stroke   Patient's presentation is most consistent with acute presentation with potential threat to life or bodily function.   PLAN: Will obtain CTA of the head and neck, CT venogram.  Will give migraine cocktail.   Will check basic lab work.   MEDICATIONS GIVEN IN ED: Medications  0.9 %  sodium chloride  infusion ( Intravenous New Bag/Given 10/07/24 0330)  sodium chloride  0.9 % bolus 1,000 mL (1,000 mLs Intravenous New Bag/Given 10/07/24 0054)  prochlorperazine  (COMPAZINE ) injection 10 mg (10 mg Intravenous Given 10/07/24 0055)  ketorolac  (TORADOL ) 30 MG/ML injection 30 mg (30 mg Intravenous Given 10/07/24 0056)  iohexol  (OMNIPAQUE ) 350 MG/ML injection 75 mL (75 mLs Intravenous Contrast Given 10/07/24 0140)  diphenhydrAMINE  (BENADRYL ) injection 25 mg (25 mg Intravenous Given 10/07/24 0157)  LORazepam  (ATIVAN ) injection 0.5 mg (0.5 mg Intravenous Given 10/07/24 0157)  LORazepam  (ATIVAN ) injection 0.5 mg (0.5 mg Intravenous Given 10/07/24 0209)  LORazepam  (ATIVAN ) injection 1 mg (1 mg Intravenous Given 10/07/24 0328)  diphenhydrAMINE  (BENADRYL ) injection 25 mg (25 mg Intravenous Given 10/07/24 0430)  rOPINIRole  (REQUIP ) tablet 1 mg (1 mg Oral Given 10/07/24 0451)     ED COURSE: Labs show no leukocytosis, normal hemoglobin, normal electrolytes, negative pregnancy test.  Head CT obtained and reviewed and interpreted by myself and the radiologist and shows no acute abnormality.  Unfortunately patient became very restless and agitated after receiving Compazine .  She has received several doses of Benadryl  and Ativan  to help with the symptoms but this has prohibited us  from getting stat CT venogram, CTA head and neck.  4:20 AM  Pt now drowsy but awake and able to answer questions and follow commands.  She states that this restlessness has happened with her before with headache medications.  She is not sure what they gave her previously to help with symptoms.  Will give additional Benadryl  and Requip .  My suspicion now for cavernous sinus thrombosis or ruptured aneurysm, intracranial hemorrhage is quite low given headache is now completely resolved and noncontrast head CT is negative.  Will discontinue these orders.  Anticipate that she  can be discharged in the morning once she is feeling better and more awake.  6:10 AM  Pt's mother is here to take her home.  Patient is awake, alert, able to answer questions.  Restlessness has improved but still has very occasional involuntary movements.  She is comfortable with plan for discharge.  At this time, I do not feel  there is any life-threatening condition present. I reviewed all nursing notes, vitals, pertinent previous records.  All lab and urine results, EKGs, imaging ordered have been independently reviewed and interpreted by myself.  I reviewed all available radiology reports from any imaging ordered this visit.  Based on my assessment, I feel the patient is safe to be discharged home without further emergent workup and can continue workup as an outpatient as needed. Discussed all findings, treatment plan as well as usual and customary return precautions.  They verbalize understanding and are comfortable with this plan.  Outpatient follow-up has been provided as needed.  All questions have been answered.    CONSULTS:  none   OUTSIDE RECORDS REVIEWED: Reviewed most recent family medicine and psychiatry notes.       FINAL CLINICAL IMPRESSION(S) / ED DIAGNOSES   Final diagnoses:  Severe headache  Acute medication-induced akathisia     Rx / DC Orders   ED Discharge Orders          Ordered    Ambulatory Referral to Primary Care (Establish Care)        10/07/24 0506             Note:  This document was prepared using Dragon voice recognition software and may include unintentional dictation errors.   Nolyn Swab, Josette SAILOR, DO 10/07/24 0507    Momo Braun, Josette SAILOR, DO 10/07/24 9389  "

## 2024-10-07 NOTE — ED Notes (Signed)
 Pt making phone calls coordinating a ride home. Pending discharge. Report given to Therisa PARAS RN

## 2024-10-20 ENCOUNTER — Emergency Department: Admission: EM | Admit: 2024-10-20 | Discharge: 2024-10-20 | Disposition: A | Discharge status: Home / Self Care

## 2024-10-20 ENCOUNTER — Emergency Department

## 2024-10-20 ENCOUNTER — Encounter: Payer: Self-pay | Admitting: *Deleted

## 2024-10-20 ENCOUNTER — Other Ambulatory Visit: Payer: Self-pay

## 2024-10-20 DIAGNOSIS — R0789 Other chest pain: Secondary | ICD-10-CM | POA: Diagnosis present

## 2024-10-20 DIAGNOSIS — R079 Chest pain, unspecified: Secondary | ICD-10-CM

## 2024-10-20 DIAGNOSIS — M25511 Pain in right shoulder: Secondary | ICD-10-CM | POA: Diagnosis not present

## 2024-10-20 DIAGNOSIS — R1011 Right upper quadrant pain: Secondary | ICD-10-CM | POA: Diagnosis not present

## 2024-10-20 LAB — TROPONIN T, HIGH SENSITIVITY: Troponin T High Sensitivity: <6 ng/L (ref 0–19)

## 2024-10-20 LAB — CBC
HCT: 39.8 % (ref 36.0–46.0)
Hemoglobin: 13.7 g/dL (ref 12.0–15.0)
MCH: 31.6 pg (ref 26.0–34.0)
MCHC: 34.4 g/dL (ref 30.0–36.0)
MCV: 91.9 fL (ref 80.0–100.0)
Platelets: 344 K/uL (ref 150–400)
RBC: 4.33 MIL/uL (ref 3.87–5.11)
RDW: 12.9 % (ref 11.5–15.5)
WBC: 6.9 K/uL (ref 4.0–10.5)
nRBC: 0 % (ref 0.0–0.2)

## 2024-10-20 LAB — HEPATIC FUNCTION PANEL
ALT: 42 U/L (ref 0–44)
AST: 33 U/L (ref 15–41)
Albumin: 4.2 g/dL (ref 3.5–5.0)
Alkaline Phosphatase: 96 U/L (ref 38–126)
Bilirubin, Direct: 0.1 mg/dL (ref 0.0–0.2)
Indirect Bilirubin: 0.2 mg/dL — ABNORMAL LOW (ref 0.3–0.9)
Total Bilirubin: 0.4 mg/dL (ref 0.0–1.2)
Total Protein: 7.3 g/dL (ref 6.5–8.1)

## 2024-10-20 LAB — BASIC METABOLIC PANEL WITH GFR
Anion gap: 10 (ref 5–15)
BUN: 12 mg/dL (ref 6–20)
CO2: 21 mmol/L — ABNORMAL LOW (ref 22–32)
Calcium: 9 mg/dL (ref 8.9–10.3)
Chloride: 108 mmol/L (ref 98–111)
Creatinine, Ser: 0.95 mg/dL (ref 0.44–1.00)
GFR, Estimated: >60 mL/min
Glucose, Bld: 83 mg/dL (ref 70–99)
Potassium: 4.2 mmol/L (ref 3.5–5.1)
Sodium: 138 mmol/L (ref 135–145)

## 2024-10-20 LAB — LIPASE, BLOOD: Lipase: 57 U/L — ABNORMAL HIGH (ref 11–51)

## 2024-10-20 LAB — POC URINE PREG, ED: Preg Test, Ur: NEGATIVE

## 2024-10-20 MED ORDER — KETOROLAC TROMETHAMINE 30 MG/ML IJ SOLN
30.0000 mg | Freq: Once | INTRAMUSCULAR | Status: AC
  Administered 2024-10-20: 30 mg via INTRAMUSCULAR
  Filled 2024-10-20: qty 1

## 2024-10-20 MED ORDER — OXYCODONE-ACETAMINOPHEN 5-325 MG PO TABS
1.0000 | ORAL_TABLET | Freq: Once | ORAL | Status: AC
  Administered 2024-10-20: 1 via ORAL
  Filled 2024-10-20: qty 1

## 2024-10-20 MED ORDER — IOHEXOL 300 MG/ML  SOLN
100.0000 mL | Freq: Once | INTRAMUSCULAR | Status: AC | PRN
  Administered 2024-10-20: 100 mL via INTRAVENOUS

## 2024-10-20 NOTE — ED Provider Notes (Signed)
 "  [facility] Provider Note    Event Date/Time   First MD Initiated Contact with Patient 10/20/24 1756     (approximate)   History   Chest Pain   HPI  Kim Little is a 29 y.o. female with a past medical history of ADHD presenting to the emergency department complaining of right sided chest and shoulder pain which began yesterday.  Patient reports shortness of breath associated with symptoms stating that it feels as if she cannot take a deep breath.  She denies any nausea or vomiting.     Physical Exam   Triage Vital Signs: ED Triage Vitals  Encounter Vitals Group     BP 10/20/24 1659 114/62     Girls Systolic BP Percentile --      Girls Diastolic BP Percentile --      Boys Systolic BP Percentile --      Boys Diastolic BP Percentile --      Pulse Rate 10/20/24 1659 69     Resp 10/20/24 1659 18     Temp 10/20/24 1659 98.2 F (36.8 C)     Temp Source 10/20/24 1659 Oral     SpO2 10/20/24 1659 98 %     Weight 10/20/24 1656 225 lb (102.1 kg)     Height 10/20/24 1656 5' 2 (1.575 m)     Head Circumference --      Peak Flow --      Pain Score 10/20/24 1656 10     Pain Loc --      Pain Education --      Exclude from Growth Chart --     Most recent vital signs: Vitals:   10/20/24 1659  BP: 114/62  Pulse: 69  Resp: 18  Temp: 98.2 F (36.8 C)  SpO2: 98%     General: Awake, no distress.  CV:  Good peripheral perfusion.  Resp:  Normal effort.  Abd:  No distention.  Right upper quadrant tenderness to palpation Other:     ED Results / Procedures / Treatments   Labs (all labs ordered are listed, but only abnormal results are displayed) Labs Reviewed  CBC  BASIC METABOLIC PANEL WITH GFR  LIPASE, BLOOD  HEPATIC FUNCTION PANEL  POC URINE PREG, ED  TROPONIN T, HIGH SENSITIVITY     EKG  ED ECG REPORT I, Reche CHRISTELLA Leventhal, the attending physician, personally viewed and interpreted this ECG.  Date: 10/20/2024 Rate: 76  bpm Rhythm: normal sinus rhythm QRS Axis: Rightward axis Intervals: normal ST/T Wave abnormalities: normal Narrative Interpretation: no evidence of acute ischemia    RADIOLOGY I, Reche CHRISTELLA Leventhal, personally viewed and evaluated these images (plain radiographs) as part of my medical decision making, as well as reviewing the written report by the radiologist.  Chest x-ray is unremarkable.  RUQ US : IMPRESSION: 1. Nonvisualization of the gallbladder which may be secondary to gallbladder contraction, as described above. 2. Otherwise, unremarkable right upper quadrant ultrasound.   CT abdomen/pelvis: IMPRESSION: 1. No acute findings in the abdomen or pelvis. 2. Simple-appearing 2 cm right ovarian cyst, with no follow-up imaging recommended.   PROCEDURES:  Critical Care performed: No  Procedures   MEDICATIONS ORDERED IN ED: Medications  ketorolac  (TORADOL ) 30 MG/ML injection 30 mg (has no administration in time range)     IMPRESSION / MDM / ASSESSMENT AND PLAN / ED COURSE  I reviewed the triage vital signs and the nursing notes.  Differential diagnosis includes, but is not limited to, biliary disease (biliary colic, acute cholecystitis, cholangitis, choledocholithiasis, etc), intrathoracic causes for epigastric abdominal pain including ACS, gastritis, duodenitis, pancreatitis, small bowel or large bowel obstruction, abdominal aortic aneurysm, hernia, and ulcer(s).   Patient's presentation is most consistent with acute illness / injury with system symptoms.  Patient is a 29 year old female with past medical history of ADHD presenting to the emergency department complaining of right-sided chest and right shoulder pain which began yesterday.  After evaluating the patient I feel that her symptoms are likely related to her gallbladder.  Hepatic panel and lipase added to workup.  Lipase is elevated.  Ultrasound was performed but they were unable  to visualize the patient's gallbladder.  I discussed this with the patient who continues to state that she has never had her gallbladder removed.  She continues to have right upper quadrant pain.  CT ordered for further evaluation.  CT scan is unremarkable.  On reevaluation, the patient reports that she is still having pain though it was improved by the Toradol .  We discussed results at length.  Discussed plan for discharge with instructions to follow-up with her primary care physician within the next few days.  Return to the emergency department for new or worsening symptoms.      FINAL CLINICAL IMPRESSION(S) / ED DIAGNOSES   Final diagnoses:  RUQ pain  Right-sided chest pain     Rx / DC Orders   ED Discharge Orders     None        Note:  This document was prepared using Dragon voice recognition software and may include unintentional dictation errors.   [place] Reche HERO, MD 10/20/24 2141  "

## 2024-10-20 NOTE — ED Notes (Signed)
 Pt reports R side chest and shoulder pain.  Sts pain increases after eating sometimes.  Pt reports severe nausea after eating and yellow stools since shortly after her bariatric surgery.

## 2024-10-20 NOTE — ED Triage Notes (Signed)
 Pt ambulatory to triage  pt has right side chest pain and right shoulder.  Sx began yesterday.  Pt has sob.  Denies n/v  pt alert  speech clear.
